# Patient Record
Sex: Female | Born: 1990 | Race: Asian | Hispanic: No | Marital: Married | Smoking: Never smoker
Health system: Southern US, Community
[De-identification: ages and names within clinical notes are randomized; demographics above are authoritative.]

## PROBLEM LIST (undated history)

## (undated) ENCOUNTER — Inpatient Hospital Stay (HOSPITAL_COMMUNITY): Payer: Self-pay

## (undated) ENCOUNTER — Inpatient Hospital Stay (HOSPITAL_COMMUNITY): Payer: No Typology Code available for payment source

## (undated) DIAGNOSIS — Z789 Other specified health status: Secondary | ICD-10-CM

## (undated) HISTORY — DX: Other specified health status: Z78.9

## (undated) HISTORY — PX: TONSILLECTOMY: SUR1361

---
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
## 2018-08-02 HISTORY — PX: COSMETIC SURGERY: SHX468

## 2020-01-02 NOTE — L&D Delivery Note (Signed)
OB/GYN Faculty Practice Delivery Note  Shoua Edrick Oh is a 30 y.o. G1P1001 s/p NSVD at [redacted]w[redacted]d. She was admitted for IOL for oligohydramnios.   ROM: 11h 20m with light meconium fluid GBS Status: Negative Maximum Maternal Temperature: 98.8  Labor Progress: Patient represented for IOL for oligohydramnios, had a cooks placed and received cytotec, and then progressed to pitocin and was complete  Delivery Date/Time: 09/10/20 at 0854 Delivery: Called to room and patient was complete and pushing. Head delivered OA. Loose nuchal cord present and delivered through. There was then a 30 second shoulder dystocia that was relieved with McRoberts and suprapubic pressure and body delivered in usual fashion. Infant was initially stunned and the Cord was clamped x 2 and cut by Dr. Ephriam Jenkins. Cord blood drawn. Placenta delivered spontaneously with gentle cord traction. Fundus firm with massage and Pitocin. Labia, perineum, vagina, and cervix inspected and found to have a 2nd degree laceration which was repaired with 3-0 Vicryl suture  Placenta: 3 vessel cord, intact, to L&D Complications: 30 second shoulder dystocia Lacerations: 2nd degree EBL: 200cc Analgesia: epidural  Infant: female  APGARs 7,9  3756g  Warner Mccreedy, MD, MPH OB Fellow, Faculty Practice Center for [employer], [facility]

## 2020-01-19 ENCOUNTER — Other Ambulatory Visit: Payer: Self-pay

## 2020-01-19 ENCOUNTER — Ambulatory Visit (INDEPENDENT_AMBULATORY_CARE_PROVIDER_SITE_OTHER): Payer: Self-pay

## 2020-01-19 DIAGNOSIS — Z3201 Encounter for pregnancy test, result positive: Secondary | ICD-10-CM

## 2020-01-19 LAB — POCT URINE PREGNANCY: Preg Test, Ur: POSITIVE — AB

## 2020-01-19 NOTE — Progress Notes (Unsigned)
Patient presented to the office today for UPT. Patient took a pregnancy test at home and it was positive.  UPT: Positive LMP: 12/03/2019 EDD: Approximate 09/15/2020 Patient advise to start prenatal care around 10 weeks.

## 2020-02-23 ENCOUNTER — Ambulatory Visit (INDEPENDENT_AMBULATORY_CARE_PROVIDER_SITE_OTHER): Payer: PRIVATE HEALTH INSURANCE

## 2020-02-23 ENCOUNTER — Other Ambulatory Visit: Payer: Self-pay

## 2020-02-23 VITALS — BP 122/75 | HR 72 | Ht 64.0 in | Wt 196.9 lb

## 2020-02-23 DIAGNOSIS — Z789 Other specified health status: Secondary | ICD-10-CM

## 2020-02-23 DIAGNOSIS — Z3400 Encounter for supervision of normal first pregnancy, unspecified trimester: Secondary | ICD-10-CM | POA: Insufficient documentation

## 2020-02-23 DIAGNOSIS — Z3401 Encounter for supervision of normal first pregnancy, first trimester: Secondary | ICD-10-CM | POA: Insufficient documentation

## 2020-02-23 DIAGNOSIS — O3680X0 Pregnancy with inconclusive fetal viability, not applicable or unspecified: Secondary | ICD-10-CM

## 2020-02-23 MED ORDER — VITAFOL ULTRA 29-0.6-0.4-200 MG PO CAPS: 1.0000 | ORAL_CAPSULE | Freq: Every day | ORAL | 11 refills | Status: DC

## 2020-02-23 NOTE — Progress Notes (Signed)
PRENATAL INTAKE SUMMARY  Ms. Edrick Oh presents today New OB Nurse Interview.  OB History    Gravida  1   Para      Term      Preterm      AB      Living  0     SAB      IAB      Ectopic      Multiple      Live Births  0          I have reviewed the patient's medical, obstetrical, social, and family histories, medications, and available lab results.  SUBJECTIVE She has no unusual complaints  OBJECTIVE Initial Physical Exam (New OB)  GENERAL APPEARANCE: alert, well appearing   ASSESSMENT Normal pregnancy  PLAN Prenatal care to be completed at [facility] All New OB labs to be completed at [facility] provider visit Baby Scripts ordered Blood pressure cuff given U/S performed today reveals single live IUP at [facility] score:0 GAD 7 score: 2

## 2020-02-24 ENCOUNTER — Telehealth: Payer: Self-pay

## 2020-03-01 ENCOUNTER — Encounter: Payer: Self-pay | Admitting: Advanced Practice Midwife

## 2020-03-01 ENCOUNTER — Other Ambulatory Visit (HOSPITAL_COMMUNITY)
Admission: RE | Admit: 2020-03-01 | Discharge: 2020-03-01 | Disposition: A | Discharge status: Home / Self Care | Payer: 59 | Source: Ambulatory Visit | Attending: Advanced Practice Midwife | Admitting: Advanced Practice Midwife

## 2020-03-01 ENCOUNTER — Ambulatory Visit (INDEPENDENT_AMBULATORY_CARE_PROVIDER_SITE_OTHER): Payer: PRIVATE HEALTH INSURANCE | Admitting: Advanced Practice Midwife

## 2020-03-01 ENCOUNTER — Other Ambulatory Visit: Payer: Self-pay

## 2020-03-01 VITALS — BP 130/73 | HR 82 | Wt 194.0 lb

## 2020-03-01 DIAGNOSIS — Z124 Encounter for screening for malignant neoplasm of cervix: Secondary | ICD-10-CM | POA: Diagnosis not present

## 2020-03-01 DIAGNOSIS — Z3A11 11 weeks gestation of pregnancy: Secondary | ICD-10-CM | POA: Diagnosis not present

## 2020-03-01 DIAGNOSIS — Z3401 Encounter for supervision of normal first pregnancy, first trimester: Secondary | ICD-10-CM

## 2020-03-01 DIAGNOSIS — Z23 Encounter for immunization: Secondary | ICD-10-CM | POA: Diagnosis not present

## 2020-03-01 NOTE — Progress Notes (Signed)
Pt presents today for NOB visit. Pt declines genetic screening. No complaints today.

## 2020-03-01 NOTE — Progress Notes (Signed)
Subjective:   Madeline Cooper is a 30 y.o. G1P0 at [redacted]w[redacted]d by LMP, c/w 11 week [place], being seen today for her first obstetrical visit.  Her obstetrical history is significant for none G1 and has Encounter for supervision of normal first pregnancy in first trimester on their problem list.. Patient does intend to breast feed. Pregnancy history fully reviewed.  Patient reports nausea.  HISTORY: OB History  Gravida Para Term Preterm AB Living  1 0 0 0 0 0  SAB IAB Ectopic Multiple Live Births  0 0 0 0 0    # Outcome Date GA Lbr Len/2nd Weight Sex Delivery Anes PTL Lv  1 Current            History reviewed. No pertinent past medical history. Past Surgical History:  Procedure Laterality Date  . COSMETIC SURGERY  08/2018   Eyelids  . TONSILECTOMY, ADENOIDECTOMY, BILATERAL MYRINGOTOMY AND TUBES  1999  . WISDOM TOOTH EXTRACTION  2009   Family History  Problem Relation Age of Onset  . Diabetes Mother   . Hypertension Maternal Grandfather   . Dementia Maternal Grandfather    Social History   Tobacco Use  . Smoking status: Never Smoker  . Smokeless tobacco: Never Used  Vaping Use  . Vaping Use: Never used  Substance Use Topics  . Alcohol use: Not Currently    Comment: last drink December 2021  . Drug use: Never   No Known Allergies Current Outpatient Medications on File Prior to Visit  Medication Sig Dispense Refill  . Prenat-Fe Poly-Methfol-FA-DHA (VITAFOL ULTRA) 29-0.6-0.4-200 MG CAPS Take 1 capsule by mouth daily. 30 capsule 11   No current facility-administered medications on file prior to visit.     Indications for ASA therapy (per uptodate) One of the following: Previous pregnancy with preeclampsia, especially early onset and with an adverse outcome No Multifetal gestation No Chronic hypertension No Type 1 or 2 diabetes mellitus No Chronic kidney disease No Autoimmune disease (antiphospholipid syndrome, systemic lupus erythematosus) No   Two or more of the  following: Nulliparity Yes Obesity (body mass index >30 kg/m2) No Family history of preeclampsia in mother or sister No Age ?35 years No Sociodemographic characteristics (African American race, low socioeconomic level) No Personal risk factors (eg, previous pregnancy with low birth weight or small for gestational age infant, previous adverse pregnancy outcome [eg, stillbirth], interval >10 years between pregnancies) No   Indications for early 1 hour GTT (per uptodate)  BMI >25 (>23 in Asian women) AND one of the following  Gestational diabetes mellitus in a previous pregnancy No Glycated hemoglobin ?5.7 percent (39 mmol/mol), impaired glucose tolerance, or impaired fasting glucose on previous testing No First-degree relative with diabetes No High-risk race/ethnicity (eg, African American, Latino, Native American, [place] American, Pacific Islander) Yes History of cardiovascular disease No Hypertension or on therapy for hypertension No High-density lipoprotein cholesterol level <35 mg/dL (2.84 mmol/L) and/or a triglyceride level >250 mg/dL (1.32 mmol/L) No Polycystic ovary syndrome No Physical inactivity No Other clinical condition associated with insulin resistance (eg, severe obesity, acanthosis nigricans) No Previous birth of an infant weighing ?4000 g No Previous stillbirth of unknown cause No Exam   Vitals:   03/01/20 1424  BP: 130/73  Pulse: 82  Weight: 194 lb (88 kg)      Uterus:     Pelvic Exam: Perineum: no hemorrhoids, normal perineum   Vulva: normal external genitalia, no lesions   Vagina:  normal mucosa, normal discharge  Cervix: no lesions and normal, pap smear done.    Adnexa: normal adnexa and no mass, fullness, tenderness   Bony Pelvis: average  System: General: well-developed, well-nourished female in no acute distress   Breast:  normal appearance, no masses or tenderness   Skin: normal coloration and turgor, no rashes   Neurologic: oriented, normal,  negative, normal mood   Extremities: normal strength, tone, and muscle mass, ROM of all joints is normal   HEENT PERRLA, extraocular movement intact and sclera clear, anicteric   Mouth/Teeth mucous membranes moist, pharynx normal without lesions and dental hygiene good   Neck supple and no masses   Cardiovascular: regular rate and rhythm   Respiratory:  no respiratory distress, normal breath sounds   Abdomen: soft, non-tender; bowel sounds normal; no masses,  no organomegaly     Assessment:   Pregnancy: G1P0 Patient Active Problem List   Diagnosis Date Noted  . Encounter for supervision of normal first pregnancy in first trimester 02/23/2020     Plan:  1. Encounter for supervision of normal first pregnancy in first trimester --Anticipatory guidance about next visits/weeks of pregnancy given. --next visit in 4 weeks in the office  - Cervicovaginal ancillary only - Culture, OB Urine - CBC/D/Plt+RPR+Rh+ABO+Rub Ab... - Genetic Screening - Flu Vaccine QUAD 36+ mos IM (Fluarix, Fluzone & Afluria Quad PF - Hemoglobin A1c  2. [redacted] weeks gestation of pregnancy    Initial labs drawn. Continue prenatal vitamins. Discussed and offered genetic screening options, including Quad screen/AFP, NIPS testing, and option to decline testing. Benefits/risks/alternatives reviewed. Pt aware that anatomy US is form of genetic screening with lower accuracy in detecting trisomies than blood work.  Pt declines genetic screening today. NIPS: declined. Ultrasound discussed; fetal anatomic survey: requested. Problem list reviewed and updated. The nature of [facility] with multiple MDs and other Advanced Practice Providers was explained to patient; also emphasized that residents, students are part of our team. Routine obstetric precautions reviewed. No follow-ups on file.   Sharen Counter, CNM 03/01/20 5:22 PM

## 2020-03-02 ENCOUNTER — Other Ambulatory Visit: Payer: Self-pay | Admitting: Advanced Practice Midwife

## 2020-03-02 ENCOUNTER — Other Ambulatory Visit (HOSPITAL_COMMUNITY)
Admission: RE | Admit: 2020-03-02 | Discharge: 2020-03-02 | Disposition: A | Discharge status: Home / Self Care | Payer: 59 | Source: Ambulatory Visit | Attending: Advanced Practice Midwife | Admitting: Advanced Practice Midwife

## 2020-03-02 DIAGNOSIS — Z124 Encounter for screening for malignant neoplasm of cervix: Secondary | ICD-10-CM | POA: Diagnosis present

## 2020-03-02 LAB — CBC/D/PLT+RPR+RH+ABO+RUB AB...
Antibody Screen: NEGATIVE
Basophils Absolute: 0.1 10*3/uL (ref 0.0–0.2)
Basos: 0 %
EOS (ABSOLUTE): 0.1 10*3/uL (ref 0.0–0.4)
Eos: 1 %
HCV Ab: <0.1 s/co ratio (ref 0.0–0.9)
HIV Screen 4th Generation wRfx: NONREACTIVE
Hematocrit: 39.8 % (ref 34.0–46.6)
Hemoglobin: 13.4 g/dL (ref 11.1–15.9)
Hepatitis B Surface Ag: NEGATIVE
Immature Grans (Abs): 0 10*3/uL (ref 0.0–0.1)
Immature Granulocytes: 0 %
Lymphocytes Absolute: 2.2 10*3/uL (ref 0.7–3.1)
Lymphs: 14 %
MCH: 30.7 pg (ref 26.6–33.0)
MCHC: 33.7 g/dL (ref 31.5–35.7)
MCV: 91 fL (ref 79–97)
Monocytes Absolute: 1.3 10*3/uL — ABNORMAL HIGH (ref 0.1–0.9)
Monocytes: 8 %
Neutrophils Absolute: 11.9 10*3/uL — ABNORMAL HIGH (ref 1.4–7.0)
Neutrophils: 77 %
Platelets: 354 10*3/uL (ref 150–450)
RBC: 4.36 x10E6/uL (ref 3.77–5.28)
RDW: 12.4 % (ref 11.7–15.4)
RPR Ser Ql: NONREACTIVE
Rh Factor: POSITIVE
Rubella Antibodies, IGG: 1.95 index (ref >0.99)
WBC: 15.6 10*3/uL — ABNORMAL HIGH (ref 3.4–10.8)

## 2020-03-02 LAB — CERVICOVAGINAL ANCILLARY ONLY
Chlamydia: NEGATIVE
Comment: NEGATIVE
Comment: NEGATIVE
Comment: NORMAL
Neisseria Gonorrhea: NEGATIVE
Trichomonas: NEGATIVE

## 2020-03-02 LAB — HEMOGLOBIN A1C
Est. average glucose Bld gHb Est-mCnc: 105 mg/dL
Hgb A1c MFr Bld: 5.3 % (ref 4.8–5.6)

## 2020-03-02 LAB — HCV INTERPRETATION

## 2020-03-02 NOTE — Addendum Note (Signed)
Addended by: Sharen Counter A on: 03/02/2020 09:14 AM   Modules accepted: Orders

## 2020-03-03 LAB — URINE CULTURE, OB REFLEX

## 2020-03-03 LAB — CYTOLOGY - PAP: Diagnosis: NEGATIVE

## 2020-03-03 LAB — CULTURE, OB URINE

## 2020-03-14 ENCOUNTER — Telehealth: Payer: Self-pay

## 2020-03-14 NOTE — Telephone Encounter (Signed)
Pt LVM has questions about "gender appt"  Unable to reach pt; vm full.

## 2020-03-29 ENCOUNTER — Other Ambulatory Visit: Payer: Self-pay

## 2020-03-29 ENCOUNTER — Ambulatory Visit (INDEPENDENT_AMBULATORY_CARE_PROVIDER_SITE_OTHER): Payer: PRIVATE HEALTH INSURANCE | Admitting: Obstetrics & Gynecology

## 2020-03-29 DIAGNOSIS — Z3401 Encounter for supervision of normal first pregnancy, first trimester: Secondary | ICD-10-CM

## 2020-03-29 NOTE — Progress Notes (Signed)
Pt is unsure of getting AFP test due to ins coverage.  Pt made aware she may have drawn up to 21 weeks.  If she chooses to verify coverage and have drawn before that time.

## 2020-03-29 NOTE — Progress Notes (Signed)
   PRENATAL VISIT NOTE  Subjective:  Madeline Cooper is a 30 y.o. G1P0 at [redacted]w[redacted]d being seen today for ongoing prenatal care.  She is currently monitored for the following issues for this low-risk pregnancy and has Encounter for supervision of normal first pregnancy in first trimester on their problem list.  Patient reports heartburn and nausea.  Contractions: Not present. Vag. Bleeding: None.   . Denies leaking of fluid.   The following portions of the patient's history were reviewed and updated as appropriate: allergies, current medications, past family history, past medical history, past social history, past surgical history and problem list.   Objective:   Vitals:   03/29/20 1459  BP: 130/84  Pulse: 65  Weight: 198 lb (89.8 kg)    Fetal Status: Fetal Heart Rate (bpm): 140         General:  Alert, oriented and cooperative. Patient is in no acute distress.  Skin: Skin is warm and dry. No rash noted.   Cardiovascular: Normal heart rate noted  Respiratory: Normal respiratory effort, no problems with respiration noted  Abdomen: Soft, gravid, appropriate for gestational age.  Pain/Pressure: Absent     Pelvic: Cervical exam deferred        Extremities: Normal range of motion.     Mental Status: Normal mood and affect. Normal behavior. Normal judgment and thought content.   Assessment and Plan:  Pregnancy: G1P0 at [redacted]w[redacted]d 1. Encounter for supervision of normal first pregnancy in first trimester We discussed her heartburn and she wants to see if she can manage without medication  Preterm labor symptoms and general obstetric precautions including but not limited to vaginal bleeding, contractions, leaking of fluid and fetal movement were reviewed in detail with the patient. Please refer to After Visit Summary for other counseling recommendations.   Return in about 4 weeks (around 04/26/2020).  No future appointments.  Scheryl Darter, MD

## 2020-03-29 NOTE — Patient Instructions (Signed)

## 2020-04-26 ENCOUNTER — Encounter: Payer: PRIVATE HEALTH INSURANCE | Admitting: Obstetrics and Gynecology

## 2020-04-27 ENCOUNTER — Ambulatory Visit (INDEPENDENT_AMBULATORY_CARE_PROVIDER_SITE_OTHER): Payer: PRIVATE HEALTH INSURANCE | Admitting: Obstetrics and Gynecology

## 2020-04-27 ENCOUNTER — Encounter: Payer: Self-pay | Admitting: Obstetrics and Gynecology

## 2020-04-27 ENCOUNTER — Ambulatory Visit: Payer: 59 | Attending: Obstetrics & Gynecology

## 2020-04-27 ENCOUNTER — Other Ambulatory Visit: Payer: Self-pay | Admitting: Obstetrics & Gynecology

## 2020-04-27 ENCOUNTER — Other Ambulatory Visit: Payer: Self-pay

## 2020-04-27 DIAGNOSIS — Z3401 Encounter for supervision of normal first pregnancy, first trimester: Secondary | ICD-10-CM

## 2020-04-27 NOTE — Patient Instructions (Signed)

## 2020-04-27 NOTE — Progress Notes (Signed)
Subjective:  Madeline Cooper is a 30 y.o. G1P0 at [redacted]w[redacted]d being seen today for ongoing prenatal care.  She is currently monitored for the following issues for this low-risk pregnancy and has Encounter for supervision of normal first pregnancy in first trimester on their problem list.  Patient reports no complaints.  Contractions: Not present. Vag. Bleeding: None.  Movement: Absent. Denies leaking of fluid.   The following portions of the patient's history were reviewed and updated as appropriate: allergies, current medications, past family history, past medical history, past social history, past surgical history and problem list. Problem list updated.  Objective:   Vitals:   04/27/20 1544  BP: 118/75  Pulse: 94  Weight: 202 lb (91.6 kg)    Fetal Status: Fetal Heart Rate (bpm): 142   Movement: Absent     General:  Alert, oriented and cooperative. Patient is in no acute distress.  Skin: Skin is warm and dry. No rash noted.   Cardiovascular: Normal heart rate noted  Respiratory: Normal respiratory effort, no problems with respiration noted  Abdomen: Soft, gravid, appropriate for gestational age. Pain/Pressure: Present     Pelvic:  Cervical exam deferred        Extremities: Normal range of motion.  Edema: None  Mental Status: Normal mood and affect. Normal behavior. Normal judgment and thought content.   Urinalysis:      Assessment and Plan:  Pregnancy: G1P0 at [redacted]w[redacted]d  1. Encounter for supervision of normal first pregnancy in first trimester Stable Declines AFP due to insurance coverage Anatomy scan today, f/u in 4 weeks to complete anatomy.  Preterm labor symptoms and general obstetric precautions including but not limited to vaginal bleeding, contractions, leaking of fluid and fetal movement were reviewed in detail with the patient. Please refer to After Visit Summary for other counseling recommendations.  Return in about 4 weeks (around 05/25/2020) for virtual, any  provider.   Hermina Staggers, MD

## 2020-04-27 NOTE — Progress Notes (Signed)
ROB [redacted]w[redacted]d  AFP Due today   CC: None

## 2020-04-28 ENCOUNTER — Other Ambulatory Visit: Payer: Self-pay | Admitting: *Deleted

## 2020-04-28 DIAGNOSIS — Z362 Encounter for other antenatal screening follow-up: Secondary | ICD-10-CM

## 2020-05-25 ENCOUNTER — Telehealth: Payer: Self-pay | Admitting: *Deleted

## 2020-05-25 ENCOUNTER — Encounter: Payer: Self-pay | Admitting: Obstetrics and Gynecology

## 2020-05-25 ENCOUNTER — Telehealth (INDEPENDENT_AMBULATORY_CARE_PROVIDER_SITE_OTHER): Payer: PRIVATE HEALTH INSURANCE | Admitting: Obstetrics and Gynecology

## 2020-05-25 DIAGNOSIS — Z3A23 23 weeks gestation of pregnancy: Secondary | ICD-10-CM

## 2020-05-25 DIAGNOSIS — Z3402 Encounter for supervision of normal first pregnancy, second trimester: Secondary | ICD-10-CM

## 2020-05-25 DIAGNOSIS — Z3401 Encounter for supervision of normal first pregnancy, first trimester: Secondary | ICD-10-CM

## 2020-05-25 NOTE — Progress Notes (Signed)
Taking OTC prenatal vitamins. Reports episode of "braxton hicks", cramping and abdominal tightening with full bladder. Resolved with urination.

## 2020-05-25 NOTE — Patient Instructions (Signed)

## 2020-05-25 NOTE — Telephone Encounter (Signed)
Telephone visit for Routine OB.

## 2020-05-25 NOTE — Progress Notes (Signed)
OBSTETRICS PRENATAL VIRTUAL VISIT ENCOUNTER NOTE  [facility]   Patient location: Home  I connected with Madeline Cooper on 05/25/20 at  4:00 PM EDT by MyChart Video Encounter and verified that I am speaking with the correct person using two identifiers. I discussed the limitations, risks, security and privacy concerns of performing an evaluation and management service virtually and the availability of in person appointments. I also discussed with the patient that there may be Cooper patient responsible charge related to this service. The patient expressed understanding and agreed to proceed. Subjective:  Madeline Cooper is Cooper 30 y.o. G1P0 at [redacted]w[redacted]d being seen today for ongoing prenatal care.  She is currently monitored for the following issues for this low-risk pregnancy and has Encounter for supervision of normal first pregnancy in first trimester on their problem list.  Patient reports no complaints.  Contractions: Irritability. Vag. Bleeding: None.  Movement: Present. Denies any leaking of fluid.   The following portions of the patient's history were reviewed and updated as appropriate: allergies, current medications, past family history, past medical history, past social history, past surgical history and problem list.   Objective:   Vitals:   05/25/20 1604  BP: 115/72  Pulse: 86  Weight: 95.3 kg    Fetal Status:     Movement: Present     General:  Alert, oriented and cooperative. Patient is in no acute distress.  Respiratory: Normal respiratory effort, no problems with respiration noted  Mental Status: Normal mood and affect. Normal behavior. Normal judgment and thought content.  Rest of physical exam deferred due to type of encounter  Imaging: [place] MFM OB DETAIL +14 WK  Result Date: 04/27/2020 ----------------------------------------------------------------------  OBSTETRICS REPORT                       (Signed Final 04/27/2020 01:26  pm) ---------------------------------------------------------------------- Patient Info  ID #:       [id number]                          D.O.B.:  04/29/1990 (30 yrs)  Name:       Madeline Cooper                Visit Date: 04/27/2020 12:57 pm ---------------------------------------------------------------------- Performed By  Attending:        Noralee Space Cooper        Ref. Address:     [address]                                                             [place], [place]  [id number]  Performed By:     Madeline Cooper RDMS       [facility]  Referred By:      Madeline Cooper                    Madeline Cooper ---------------------------------------------------------------------- Orders  #  Description                           Code        Ordered By  1  US MFM OB DETAIL +14 WK               76811.01    Madeline Cooper ----------------------------------------------------------------------  #  Order #                     Accession #                Episode #  1  [id number]                   [id number]                 [id number] ---------------------------------------------------------------------- Indications  [redacted] weeks gestation of pregnancy                Z3A.20  Obesity complicating pregnancy, second         O99.212  trimester BMI 34  Encounter for antenatal screening for          Z36.3  malformations ---------------------------------------------------------------------- Fetal Evaluation  Num Of Fetuses:         1  Fetal Heart Rate(bpm):  147  Cardiac Activity:       Observed  Presentation:           Cephalic  Placenta:               Anterior  P. Cord Insertion:      Visualized,  central  Amniotic Fluid  AFI FV:      Within normal limits                              Largest Pocket(cm)  7 ---------------------------------------------------------------------- Biometry  BPD:        49  mm     G. Age:  20w 6d         66  %    CI:        70.47   %    70 - 86                                                          FL/HC:      17.6   %    16.8 - 19.8  HC:      186.1  mm     G. Age:  21w 0d         65  %    HC/AC:      1.15        1.09 - 1.39  AC:      161.8  mm     G. Age:  21w 2d         71  %    FL/BPD:     66.7   %  FL:       32.7  mm     G. Age:  20w 1d         34  %    FL/AC:      20.2   %    20 - 24  HUM:      32.1  mm     G. Age:  20w 5d         59  %  CER:      21.4  mm     G. Age:  20w 2d         62  %  NFT:       3.5  mm  LV:        6.9  mm  CM:        5.5  mm  Est. FW:     378  gm    0 lb 13 oz      66  % ---------------------------------------------------------------------- Gestational Age  U/S Today:     20w 6d                                        EDD:   09/08/20  Best:          Madeline Cooper 3d     Det. By:  U/S C R L  (02/23/20)    EDD:   09/11/20 ---------------------------------------------------------------------- Anatomy  Cranium:               Appears normal         LVOT:                   Appears normal  Cavum:                 Appears normal         Aortic Arch:            Appears normal  Ventricles:            Appears normal         Ductal Arch:  Appears normal  Choroid Plexus:        Appears normal         Diaphragm:              Appears normal  Cerebellum:            Appears normal         Stomach:                Appears normal, left                                                                        sided  Posterior Fossa:       Appears normal         Abdomen:                Appears normal  Nuchal Fold:           Appears normal         Abdominal Wall:         Appears nml (cord                                                                         insert, abd wall)  Face:                  Appears normal         Cord Vessels:           Appears normal (3                         (orbits and profile)                           vessel cord)  Lips:                  Appears normal         Kidneys:                Appear normal  Palate:                Appears normal         Bladder:                Appears normal  Thoracic:              Appears normal         Spine:                  Not well visualized  Heart:                 Appears normal         Upper Extremities:      Appears normal                         (4CH, axis,  and                         situs)  RVOT:                  Appears normal         Lower Extremities:      Appears normal  Other:  Fetus appears to be Cooper female. Heels and 5th digit visualized. Nasal          bone visualized. VC, 3VV and 3VTV visualized. ---------------------------------------------------------------------- Cervix Uterus Adnexa  Cervix  Length:           4.95  cm.  Normal appearance by transabdominal scan.  Right Ovary  Not visualized.  Left Ovary  Not visualized.  Adnexa  No abnormality visualized. ---------------------------------------------------------------------- Impression  G1 P0. Patient is here for fetal anatomy scan.  Patient had opted not to screen for fetal aneuploidies.  We performed fetal anatomy scan. No makers of  aneuploidies or fetal structural defects are seen. Fetal  biometry is consistent with her previously-established dates.  Amniotic fluid is normal and good fetal activity is seen.  Patient understands the limitations of ultrasound in detecting  fetal anomalies. ---------------------------------------------------------------------- Recommendations  -An appointment was made for her to return in 4 weeks for  completion of fetal anatomy (fetal spine). ----------------------------------------------------------------------                  Madeline Space, Cooper Electronically Signed Final Report   04/27/2020 01:26 pm  ----------------------------------------------------------------------   Assessment and Plan:  Pregnancy: G1P0 at [redacted]w[redacted]d 1. Encounter for supervision of normal first pregnancy in first trimester Stable F/U U/S 05/27/20 Glucola next visit  Preterm labor symptoms and general obstetric precautions including but not limited to vaginal bleeding, contractions, leaking of fluid and fetal movement were reviewed in detail with the patient. I discussed the assessment and treatment plan with the patient. The patient was provided an opportunity to ask questions and all were answered. The patient agreed with the plan and demonstrated an understanding of the instructions. The patient was advised to call back or seek an in-person office evaluation/go to MAU at [facility] for any urgent or concerning symptoms. Please refer to After Visit Summary for other counseling recommendations.   I provided 8 minutes of face-to-face time during this encounter.  Return in about 4 weeks (around 06/22/2020) for OB visit, face to face, any provider, fasting for Glucola.  Future Appointments  Date Time Provider Department Center  05/27/2020  2:30 PM [facility] NURSE [facility]  05/27/2020  2:45 PM [facility]    Hermina Staggers, [facility] for [facility], [facility]

## 2020-05-27 ENCOUNTER — Encounter: Payer: Self-pay | Admitting: *Deleted

## 2020-05-27 ENCOUNTER — Ambulatory Visit: Payer: PRIVATE HEALTH INSURANCE | Attending: Obstetrics and Gynecology

## 2020-05-27 ENCOUNTER — Ambulatory Visit: Payer: PRIVATE HEALTH INSURANCE | Admitting: *Deleted

## 2020-05-27 ENCOUNTER — Other Ambulatory Visit: Payer: Self-pay | Admitting: Obstetrics

## 2020-05-27 ENCOUNTER — Other Ambulatory Visit: Payer: Self-pay

## 2020-05-27 DIAGNOSIS — O3662X0 Maternal care for excessive fetal growth, second trimester, not applicable or unspecified: Secondary | ICD-10-CM

## 2020-05-27 DIAGNOSIS — Z362 Encounter for other antenatal screening follow-up: Secondary | ICD-10-CM | POA: Diagnosis present

## 2020-05-27 DIAGNOSIS — Z3401 Encounter for supervision of normal first pregnancy, first trimester: Secondary | ICD-10-CM | POA: Diagnosis present

## 2020-05-27 DIAGNOSIS — O99212 Obesity complicating pregnancy, second trimester: Secondary | ICD-10-CM

## 2020-06-22 ENCOUNTER — Other Ambulatory Visit: Payer: PRIVATE HEALTH INSURANCE

## 2020-06-22 ENCOUNTER — Ambulatory Visit (INDEPENDENT_AMBULATORY_CARE_PROVIDER_SITE_OTHER): Payer: PRIVATE HEALTH INSURANCE | Admitting: Women's Health

## 2020-06-22 ENCOUNTER — Other Ambulatory Visit: Payer: Self-pay

## 2020-06-22 VITALS — BP 109/75 | HR 96 | Wt 213.0 lb

## 2020-06-22 DIAGNOSIS — Z3A27 27 weeks gestation of pregnancy: Secondary | ICD-10-CM

## 2020-06-22 DIAGNOSIS — Z3401 Encounter for supervision of normal first pregnancy, first trimester: Secondary | ICD-10-CM | POA: Diagnosis not present

## 2020-06-22 DIAGNOSIS — O2602 Excessive weight gain in pregnancy, second trimester: Secondary | ICD-10-CM

## 2020-06-22 DIAGNOSIS — Z23 Encounter for immunization: Secondary | ICD-10-CM

## 2020-06-22 NOTE — Patient Instructions (Signed)
Maternity Assessment Unit (MAU)  The Maternity Assessment Unit (MAU) is located at the [facility] and [facility]. The address is: [address] [place], [place], [place] [address]. Please see map below for additional directions.    The Maternity Assessment Unit is designed to help you during your pregnancy, and for up to 6 weeks after delivery, with any pregnancy- or postpartum-related emergencies, if you think you are in labor, or if your water has broken. For example, if you experience nausea and vomiting, vaginal bleeding, severe abdominal or pelvic pain, elevated blood pressure or other problems related to your pregnancy or postpartum time, please come to the Maternity Assessment Unit for assistance.       Oral Glucose Tolerance Test During Pregnancy Why am I having this test? The oral glucose tolerance test (OGTT) is done to check how your body processes blood sugar (glucose). This is one of several tests used to diagnose diabetes that develops during pregnancy (gestational diabetes mellitus). Gestational diabetes is a short-term form of diabetes that some women develop while they are pregnant. It usually occurs during the second trimesterof pregnancy and goes away after delivery. Testing, or screening, for gestational diabetes usually occurs at weeks 24-28 of pregnancy. You may have the OGTT test after having a 1-hour glucose screening test if the results from that test indicate that you may have gestational diabetes. This test may also be needed if: You have a history of gestational diabetes. There is a history of giving birth to very large babies or of losing pregnancies (having stillbirths). You have signs and symptoms of diabetes, such as: Changes in your eyesight. Tingling or numbness in your hands or feet. Changes in hunger, thirst, and urination, and these are not explained by your pregnancy. What is being tested? This test measures the amount  of glucose in your blood at different timesduring a period of 3 hours. This shows how well your body can process glucose. What kind of sample is taken?  Blood samples are required for this test. They are usually collected byinserting a needle into a blood vessel. How do I prepare for this test? For 3 days before your test, eat normally. Have plenty of carbohydrate-rich foods. Follow instructions from your health care provider about: Eating or drinking restrictions on the day of the test. You may be asked not to eat or drink anything other than water (to fast) starting 8-10 hours before the test. Changing or stopping your regular medicines. Some medicines may interfere with this test. Tell a health care provider about: All medicines you are taking, including vitamins, herbs, eye drops, creams, and over-the-counter medicines. Any blood disorders you have. Any surgeries you have had. Any medical conditions you have. What happens during the test? First, your blood glucose will be measured. This is referred to as your fasting blood glucose because you fasted before the test. Then, you will drink a glucose solution that contains a certain amount of glucose. Your blood glucosewill be measured again 1, 2, and 3 hours after you drink the solution. This test takes about 3 hours to complete. You will need to stay at the testing location during this time. During the testing period: Do not eat or drink anything other than the glucose solution. Do not exercise. Do not use any products that contain nicotine or tobacco, such as cigarettes, e-cigarettes, and chewing tobacco. These can affect your test results. If you need help quitting, ask your health care provider. The testing  procedure may vary among health care providers and hospitals. How are the results reported? Your results will be reported as milligrams of glucose per deciliter of blood (mg/dL) or millimoles per liter (mmol/L). There is more than one  source for screening and diagnosis reference values used to diagnose gestational diabetes. Your health care provider will compare your results to normal values that were established after testing a large group of people (reference values). Reference values may vary among labs and hospitals. For this test (Carpenter-Coustan), reference values are: Fasting: 95 mg/dL (5.3 mmol/L). 1 hour: 180 mg/dL (81.1 mmol/L). 2 hour: 155 mg/dL (8.6 mmol/L). 3 hour: 140 mg/dL (7.8 mmol/L). What do the results mean? Results below the reference values are considered normal. If two or more of your blood glucose levels are at or above the reference values, you may be diagnosed with gestational diabetes. If only one level is high, your healthcare provider may suggest repeat testing or other tests to confirm a diagnosis. Talk with your health care provider about what your results mean. Questions to ask your health care provider Ask your health care provider, or the department that is doing the test: When will my results be ready? How will I get my results? What are my treatment options? What other tests do I need? What are my next steps? Summary The oral glucose tolerance test (OGTT) is one of several tests used to diagnose diabetes that develops during pregnancy (gestational diabetes mellitus). Gestational diabetes is a short-term form of diabetes that some women develop while they are pregnant. You may have the OGTT test after having a 1-hour glucose screening test if the results from that test show that you may have gestational diabetes. You may also have this test if you have any symptoms or risk factors for this type of diabetes. Talk with your health care provider about what your results mean. This information is not intended to replace advice given to you by your health care provider. Make sure you discuss any questions you have with your healthcare provider. Document Revised: 05/28/2019 Document Reviewed:  05/28/2019 Elsevier Patient Education  2022 Elsevier Inc.       Tdap (Tetanus, Diphtheria, Pertussis) Vaccine: What You Need to Know 1. Why get vaccinated? Tdap vaccine can prevent tetanus, diphtheria, and pertussis. Diphtheria and pertussis spread from person to person. Tetanus enters the body through cuts or wounds. TETANUS (T) causes painful stiffening of the muscles. Tetanus can lead to serious health problems, including being unable to open the mouth, having trouble swallowing and breathing, or death. DIPHTHERIA (D) can lead to difficulty breathing, heart failure, paralysis, or death. PERTUSSIS (aP), also known as "whooping cough," can cause uncontrollable, violent coughing that makes it hard to breathe, eat, or drink. Pertussis can be extremely serious especially in babies and young children, causing pneumonia, convulsions, brain damage, or death. In teens and adults, it can cause weight loss, loss of bladder control, passing out, and rib fractures from severe coughing. 2. Tdap vaccine Tdap is only for children 7 years and older, adolescents, and adults.  Adolescents should receive a single dose of Tdap, preferably at age 75 or 12 years. Pregnant people should get a dose of Tdap during every pregnancy, preferably during the early part of the third trimester, to help protect the newborn from pertussis. Infants are most at risk for severe, life-threatening complications frompertussis. Adults who have never received Tdap should get a dose of Tdap. Also, adults should receive a booster dose of either Tdap or  Td (a different vaccine that protects against tetanus and diphtheria but not pertussis) every 10 years, or after 5 years in the case of a severe or dirty wound or burn. Tdap may be given at the same time as other vaccines. 3. Talk with your health care provider Tell your vaccine provider if the person getting the vaccine: Has had an allergic reaction after a previous dose of any  vaccine that protects against tetanus, diphtheria, or pertussis, or has any severe, life-threatening allergies Has had a coma, decreased level of consciousness, or prolonged seizures within 7 days after a previous dose of any pertussis vaccine (DTP, DTaP, or Tdap) Has seizures or another nervous system problem Has ever had Guillain-Barr Syndrome (also called "GBS") Has had severe pain or swelling after a previous dose of any vaccine that protects against tetanus or diphtheria In some cases, your health care provider may decide to postpone Tdapvaccination until a future visit. People with minor illnesses, such as a cold, may be vaccinated. People who are moderately or severely ill should usually wait until they recover beforegetting Tdap vaccine.  Your health care provider can give you more information. 4. Risks of a vaccine reaction Pain, redness, or swelling where the shot was given, mild fever, headache, feeling tired, and nausea, vomiting, diarrhea, or stomachache sometimes happen after Tdap vaccination. People sometimes faint after medical procedures, including vaccination. Tellyour provider if you feel dizzy or have vision changes or ringing in the ears.  As with any medicine, there is a very remote chance of a vaccine causing asevere allergic reaction, other serious injury, or death. 5. What if there is a serious problem? An allergic reaction could occur after the vaccinated person leaves the clinic. If you see signs of a severe allergic reaction (hives, swelling of the face and throat, difficulty breathing, a fast heartbeat, dizziness, or weakness), call 9-1-1and get the person to the nearest hospital. For other signs that concern you, call your health care provider.  Adverse reactions should be reported to the Vaccine Adverse Event Reporting System (VAERS). Your health care provider will usually file this report, or you can do it yourself. Visit the VAERS website at www.vaers.LAgents.no or call  8304666390. VAERS is only for reporting reactions, and VAERS staff members do not give medical advice. 6. The National Vaccine Injury Compensation Program The [employer] Vaccine Injury Compensation Program (VICP) is a federal program that was created to compensate people who may have been injured by certain vaccines. Claims regarding alleged injury or death due to vaccination have a time limit for filing, which may be as short as two years. Visit the VICP website at SpiritualWord.at or call 281 093 1052to learn about the program and about filing a claim. 7. How can I learn more? Ask your health care provider. Call your local or state health department. Visit the website of the Food and Drug Administration (FDA) for vaccine package inserts and additional information at FinderList.no. Contact the Centers for Disease Control and Prevention (CDC): Call (587)509-7158 (1-800-CDC-INFO) or Visit CDC's website at PicCapture.uy. Vaccine Information Statement Tdap (Tetanus, Diphtheria, Pertussis) Vaccine(08/07/2019) This information is not intended to replace advice given to you by your health care provider. Make sure you discuss any questions you have with your healthcare provider. Document Revised: 09/02/2019 Document Reviewed: 09/02/2019 Elsevier Patient Education  2022 [employer].       Contraception Choices (www.bedsider.org) Contraception, also called birth control, refers to methods or devices thatprevent pregnancy. Hormonal methods  Contraceptive implant A contraceptive implant is  a thin, plastic tube that contains a hormone that prevents pregnancy. It is different from an intrauterine device (IUD). It is inserted into the upper part of the arm by a health care provider. Implants canbe effective for up to 3 years. Progestin-only injections Progestin-only injections are injections of progestin, a synthetic form of thehormone  progesterone. They are given every 3 months by a health care provider. Birth control pills Birth control pills are pills that contain hormones that prevent pregnancy. They must be taken once a day, preferably at the same time each day. Aprescription is needed to use this method of contraception. Birth control patch The birth control patch contains hormones that prevent pregnancy. It is placed on the skin and must be changed once a week for three weeks and removed on thefourth week. A prescription is needed to use this method of contraception. Vaginal ring A vaginal ring contains hormones that prevent pregnancy. It is placed in the vagina for three weeks and removed on the fourth week. After that, the process is repeated with a new ring. A prescription is needed to use this method ofcontraception. Emergency contraceptive Emergency contraceptives prevent pregnancy after unprotected sex. They come in pill form and can be taken up to 5 days after sex. They work best the sooner they are taken after having sex. Most emergency contraceptives are available without a prescription. This method should not be used as your only form ofbirth control. Barrier methods  Female condom A female condom is a thin sheath that is worn over the penis during sex. Condoms keep sperm from going inside a woman's body. They can be used with a sperm-killing substance (spermicide) to increase their effectiveness. They should be thrown away after one use. Female condom A female condom is a soft, loose-fitting sheath that is put into the vagina before sex. The condom keeps sperm from going inside a woman's body. Theyshould be thrown away after one use. Diaphragm A diaphragm is a soft, dome-shaped barrier. It is inserted into the vagina before sex, along with a spermicide. The diaphragm blocks sperm from entering the uterus, and the spermicide kills sperm. A diaphragm should be left in thevagina for 6-8 hours after sex and removed within  24 hours. A diaphragm is prescribed and fitted by a health care provider. A diaphragm should be replaced every 1-2 years, after giving birth, after gaining more than15 lb (6.8 kg), and after pelvic surgery. Cervical cap A cervical cap is a round, soft latex or plastic cup that fits over the cervix. It is inserted into the vagina before sex, along with spermicide. It blocks sperm from entering the uterus. The cap should be left in place for 6-8 hours after sex and removed within 48 hours. A cervical cap must be prescribed andfitted by a health care provider. It should be replaced every 2 years. Sponge A sponge is a soft, circular piece of polyurethane foam with spermicide in it. The sponge helps block sperm from entering the uterus, and the spermicide kills sperm. To use it, you make it wet and then insert it into the vagina. It should be inserted before sex, left in for at least 6 hours after sex, and removed andthrown away within 30 hours. Spermicides Spermicides are chemicals that kill or block sperm from entering the cervix and uterus. They can come as a cream, jelly, suppository, foam, or tablet. A spermicide should be inserted into the vagina with an applicator at least 10-15 minutes before sex to allow time for  it to work. The process must be repeatedevery time you have sex. Spermicides do not require a prescription. Intrauterine contraception Intrauterine device (IUD) An IUD is a T-shaped device that is put in a woman's uterus. There are two types: Hormone IUD.This type contains progestin, a synthetic form of the hormone progesterone. This type can stay in place for 3-5 years. Copper IUD.This type is wrapped in copper wire. It can stay in place for 10 years. Permanent methods of contraception Female tubal ligation In this method, a woman's fallopian tubes are sealed, tied, or blocked duringsurgery to prevent eggs from traveling to the uterus. Hysteroscopic sterilization In this method, a  small, flexible insert is placed into each fallopian tube. The inserts cause scar tissue to form in the fallopian tubes and block them, so sperm cannot reach an egg. The procedure takes about 3 months to be effective.Another form of birth control must be used during those 3 months. Female sterilization This is a procedure to tie off the tubes that carry sperm (vasectomy). After the procedure, the man can still ejaculate fluid (semen). Another form of birth control must be used for 3 months after the procedure. Natural planning methods Natural family planning In this method, a couple does not have sex on days when the woman could become pregnant. Calendar method In this method, the woman keeps track of the length of each menstrual cycle, identifies the days when pregnancy can happen, and does not have sex on those days. Ovulation method In this method, a couple avoids sex during ovulation. Symptothermal method This method involves not having sex during ovulation. The woman typically checks for ovulation bywatching changes in her temperature and in the consistency of cervical mucus. Post-ovulation method In this method, a couple waits to have sex until after ovulation. Where to find more information Centers for Disease Control and Prevention: FootballExhibition.com.br Summary Contraception, also called birth control, refers to methods or devices that prevent pregnancy. Hormonal methods of contraception include implants, injections, pills, patches, vaginal rings, and emergency contraceptives. Barrier methods of contraception can include female condoms, female condoms, diaphragms, cervical caps, sponges, and spermicides. There are two types of IUDs (intrauterine devices). An IUD can be put in a woman's uterus to prevent pregnancy for 3-5 years. Permanent sterilization can be done through a procedure for males and females. Natural family planning methods involve nothaving sex on days when the woman could become  pregnant. This information is not intended to replace advice given to you by your health care provider. Make sure you discuss any questions you have with your healthcare provider. Document Revised: 05/25/2019 Document Reviewed: 05/25/2019 Elsevier Patient Education  2022 Elsevier Inc.       AREA PEDIATRIC/FAMILY PRACTICE PHYSICIANS  [facility] [address] [place], [place] [address] Phone - [phone]   Fax - [phone]  JACK AMOS [address] [place], [place]  [address] Phone - [phone]   Fax - [phone]  [facility] [address] [place], [place]  [address] Phone - [phone]   Fax - [phone]  [facility] [address] [place], [place]  [address] Phone - [phone]   Fax - [phone]  [facility] [address] [place], [place]  [address] Phone - [phone]   Fax - [phone]  [facility] [address] [place], [place]  [address] Phone - [phone]   Fax - [phone]  [facility] [place] [address] [place], [place]  [address] Phone - [phone]   Fax - [phone]  [facility] AT [facility] [address] [place], [address] [place], [place]  [address] Phone - [phone]   Fax - [phone]  [facility] AT [facility] [address] [place], [place]  [address] Phone - [phone]   Fax - [phone] [facility] AT [place] [address] [place], [place]  [address] Phone - [phone]   Fax - [phone]  [facility] [address] [place], [place]  [address] Phone - [phone]   Fax - [phone]  [facility] [address], [address] [place], [place]  [address] Phone - [phone]   Fax - [phone]  [facility] [place] [address] [place], [place]   [address] Phone - [phone]   Fax - [phone]  [facility] [address] [place], [place]  [address] Phone - [phone]  [facility] [address] [place], [place]  [address] Phone - [phone]   Fax - [phone]  [facility] [address] [place], [place]  [address] Phone - [phone]   Fax - [phone]  [facility] [address] [place], [place]  [address] Phone - [phone]   Fax - [phone]  [facility] [address] [place], [place]  [address] Phone - [phone]   Fax - [phone]   [facility] [address] [place], [place]  [address] Phone - [phone]   Fax - [phone]  [facility] [address] [place], [place]  [address] Phone - [phone]   Fax - [phone]  DAVID RUBIN [address] [place], [place]  [address] Phone - [phone]   Fax - [phone]  [facility] [address] [place], [place]  [address] Phone - [phone]   Fax - [phone]  [place] [facility] Alita Chyle [address] [place], [place]  [address] Phone - [phone]   Fax - [phone] Gerarda Fraction [address] [place], [place]  [address] Phone - [phone]   Fax - [phone]  [facility] [address] [place], [place]  [address] Phone - [phone]   Fax - [phone]  [facility] - [place] [address] [place], [place]  [address] Phone - [phone]   Fax - [phone]         Childbirth Education Options: [facility] Department Classes:  Childbirth education classes can help you get ready for a positive parenting experience. You can also meet other expectant parents and get free stuff for your baby. Each class runs for five weeks on the same night and costs $45 for the mother-to-be  and her support person. Medicaid covers the cost if you are eligible. Call [phone] to register. [facility] Childbirth Education:  ([phone] [phone] or 7191950006 or [email or web address]  Baby & Me Class: Discuss newborn & infant parenting and family adjustment issues with other new mothers in a relaxed environment. Each week brings a new speaker or baby-centered activity. We encourage new mothers to join [place] every Thursday at 11:00am. Babies birth until crawling. No registration or fee. Daddy [employer]: This course offers Dads-to-be the tools and knowledge needed to feel confident on their journey to becoming new fathers. Experienced dads, who have been trained as coaches, teach dads-to-be how to hold, comfort, diaper,  swaddle and play with their infant while being able to support the new mom as well. A class for men taught by men. $25/dad Big Brother/Big Sister: Let your children share in the joy of a new brother or sister in this special class designed just for them. Class includes discussion about how families care for babies: swaddling, holding, diapering, safety as well as how they can be helpful in their new role. This class is designed for children ages 2 to 74, but any age is welcome. Please register each child individually. $5/child  Mom Talk: This mom-led group offers support and connection to mothers as they journey through the adjustments and struggles of that sometimes overwhelming first year after the birth of a child. Tuesdays at 10:00am and Thursdays at 6:00pm. Babies welcome. No registration or fee. Breastfeeding Support Group: This group is a mother-to-mother support circle where moms have the opportunity to share their breastfeeding experiences. A Lactation Consultant is present for questions and concerns. Meets each Tuesday at 11:00am. No fee or registration. Breastfeeding Your Baby: Learn what to expect in the first days of breastfeeding your newborn.  This class will help  you feel more confident with the skills needed to begin your breastfeeding experience. Many new mothers are concerned about breastfeeding after leaving the hospital. This class will also address the most common fears and challenges about breastfeeding during the first few weeks, months and beyond. (call for fee) Comfort Techniques and Tour: This 2 hour interactive class will provide you the opportunity to learn & practice hands-on techniques that can help relieve some of the discomfort of labor and encourage your baby to rotate toward the best position for birth. You and your partner will be able to try a variety of labor positions with birth balls and rebozos as well as practice breathing, relaxation, and visualization techniques. A tour of the [facility] is included with this class. $20 per registrant and support person Childbirth Class- Weekend Option: This class is a Weekend version of our Birth & Baby series. It is designed for parents who have a difficult time fitting several weeks of classes into their schedule. It covers the care of your newborn and the basics of labor and childbirth. It also includes a Maternity Care Center Tour of [facility] and lunch. The class is held two consecutive days: beginning on Friday evening from 6:30 - 8:30 p.m. and the next day, Saturday from 9 a.m. - 4 p.m. (call for fee) Linden Dolin Class: Interested in a waterbirth?  This informational class will help you discover whether waterbirth is the right fit for you. Education about waterbirth itself, supplies you would need and how to assemble your support team is what you can expect from this class. Some obstetrical practices require this class in order to pursue a waterbirth. (Not all obstetrical practices offer waterbirth-check with your healthcare provider.) Register only the expectant mom, but you are encouraged to bring your partner to class! Required if planning waterbirth, no  fee. Infant/Child CPR: Parents, grandparents, babysitters, and friends learn Cardio-Pulmonary Resuscitation skills for infants and children. You will also learn how to treat both conscious and unconscious choking in infants and children. This Family & Friends program does not offer certification. Register each participant individually to ensure that enough mannequins are available. (Call for fee) Grandparent Love: Expecting a grandbaby? This class is for you! Learn about the latest infant care and safety recommendations and ways to support your own child as he or she  transitions into the parenting role. Taught by Registered Nurses who are childbirth instructors, but most importantly...they are grandmothers too! $10/person. Childbirth Class- Natural Childbirth: This series of 5 weekly classes is for expectant parents who want to learn and practice natural methods of coping with the process of labor and childbirth. Relaxation, breathing, massage, visualization, role of the partner, and helpful positioning are highlighted. Participants learn how to be confident in their body's ability to give birth. This class will empower and help parents make informed decisions about their own care. Includes discussion that will help new parents transition into the immediate postpartum period. Maternity Care Center Tour of [facility] is included. We suggest taking this class between 25-32 weeks, but it's only a recommendation. $75 per registrant and one support person or $30 Medicaid. Childbirth Class- 3 week Series: This option of 3 weekly classes helps you and your labor partner prepare for childbirth. Newborn care, labor & birth, cesarean birth, pain management, and comfort techniques are discussed and a Maternity Care Center Tour of [facility] is included. The class meets at the same time, on the same day of the week for 3 consecutive weeks beginning with the starting date you choose. $60 for registrant and one  support person.  Marvelous Multiples: Expecting twins, triplets, or more? This class covers the differences in labor, birth, parenting, and breastfeeding issues that face multiples' parents. NICU tour is included. Led by a Certified Childbirth Educator who is the mother of twins. No fee. Caring for Baby: This class is for expectant and adoptive parents who want to learn and practice the most up-to-date newborn care for their babies. Focus is on birth through the first six weeks of life. Topics include feeding, bathing, diapering, crying, umbilical cord care, circumcision care and safe sleep. Parents learn to recognize symptoms of illness and when to call the pediatrician. Register only the mom-to-be and your partner or support person can plan to come with you! $10 per registrant and support person Childbirth Class- online option: This online class offers you the freedom to complete a Birth and Baby series in the comfort of your own home. The flexibility of this option allows you to review sections at your own pace, at times convenient to you and your support people. It includes additional video information, animations, quizzes, and extended activities. Get organized with helpful eClass tools, checklists, and trackers. Once you register online for the class, you will receive an email within a few days to accept the invitation and begin the class when the time is right for you. The content will be available to you for 60 days. $60 for 60 days of online access for you and your support people.

## 2020-06-22 NOTE — Progress Notes (Signed)
Subjective:  Madeline Cooper is a 30 y.o. G1P0 at [redacted]w[redacted]d being seen today for ongoing prenatal care.  She is currently monitored for the following issues for this low-risk pregnancy and has Encounter for supervision of normal first pregnancy in first trimester on their problem list.  Patient reports no complaints.  Contractions: Not present. Vag. Bleeding: None.  Movement: Present. Denies leaking of fluid.   The following portions of the patient's history were reviewed and updated as appropriate: allergies, current medications, past family history, past medical history, past social history, past surgical history and problem list. Problem list updated.  Objective:   Vitals:   06/22/20 0924  BP: 109/75  Pulse: 96  Weight: 213 lb (96.6 kg)    Fetal Status: Fetal Heart Rate (bpm): 145 Fundal Height: 31 cm Movement: Present     General:  Alert, oriented and cooperative. Patient is in no acute distress.  Skin: Skin is warm and dry. No rash noted.   Cardiovascular: Normal heart rate noted  Respiratory: Normal respiratory effort, no problems with respiration noted  Abdomen: Soft, gravid, appropriate for gestational age. Pain/Pressure: Absent     Pelvic: Vag. Bleeding: None     Cervical exam deferred        Extremities: Normal range of motion.     Mental Status: Normal mood and affect. Normal behavior. Normal judgment and thought content.   Urinalysis:      Assessment and Plan:  Pregnancy: G1P0 at [redacted]w[redacted]d  1. Encounter for supervision of normal first pregnancy in first trimester - Glucose Tolerance, 2 Hours w/1 Hour - CBC - HIV Antibody (routine testing w rflx) - RPR - Tdap today - discussed contraception - peds list given - CBE info given - FH today 31 - EFW 90% on [place] 05/27/2020 - growth [place] scheduled 08/05/2020  2. [redacted] weeks gestation of pregnancy   Preterm labor symptoms and general obstetric precautions including but not limited to vaginal bleeding, contractions, leaking of  fluid and fetal movement were reviewed in detail with the patient. I discussed the assessment and treatment plan with the patient. The patient was provided an opportunity to ask questions and all were answered. The patient agreed with the plan and demonstrated an understanding of the instructions. The patient was advised to call back or seek an in-person office evaluation/go to MAU at [facility] for any urgent or concerning symptoms. Please refer to After Visit Summary for other counseling recommendations.  Return in about 2 weeks (around 07/06/2020) for in-person LOB/APP OK.   Merlean Pizzini, Odie Sera, NP

## 2020-06-23 ENCOUNTER — Encounter: Payer: Self-pay | Admitting: Women's Health

## 2020-06-23 ENCOUNTER — Other Ambulatory Visit: Payer: Self-pay

## 2020-06-23 DIAGNOSIS — O99019 Anemia complicating pregnancy, unspecified trimester: Secondary | ICD-10-CM | POA: Insufficient documentation

## 2020-06-23 LAB — COMPREHENSIVE METABOLIC PANEL
ALT: 8 IU/L (ref 0–32)
AST: 11 IU/L (ref 0–40)
Albumin/Globulin Ratio: 1.7 (ref 1.2–2.2)
Albumin: 3.7 g/dL — ABNORMAL LOW (ref 3.9–5.0)
Alkaline Phosphatase: 49 IU/L (ref 44–121)
BUN/Creatinine Ratio: 11 (ref 9–23)
BUN: 5 mg/dL — ABNORMAL LOW (ref 6–20)
Bilirubin Total: 0.2 mg/dL (ref 0.0–1.2)
CO2: 18 mmol/L — ABNORMAL LOW (ref 20–29)
Calcium: 8.9 mg/dL (ref 8.7–10.2)
Chloride: 107 mmol/L — ABNORMAL HIGH (ref 96–106)
Creatinine, Ser: 0.47 mg/dL — ABNORMAL LOW (ref 0.57–1.00)
Globulin, Total: 2.2 g/dL (ref 1.5–4.5)
Glucose: 85 mg/dL (ref 65–99)
Potassium: 4 mmol/L (ref 3.5–5.2)
Sodium: 141 mmol/L (ref 134–144)
Total Protein: 5.9 g/dL — ABNORMAL LOW (ref 6.0–8.5)
eGFR: 132 mL/min/{1.73_m2} (ref >59)

## 2020-06-23 LAB — CBC
Hematocrit: 31.5 % — ABNORMAL LOW (ref 34.0–46.6)
Hemoglobin: 10.5 g/dL — ABNORMAL LOW (ref 11.1–15.9)
MCH: 30 pg (ref 26.6–33.0)
MCHC: 33.3 g/dL (ref 31.5–35.7)
MCV: 90 fL (ref 79–97)
Platelets: 322 10*3/uL (ref 150–450)
RBC: 3.5 x10E6/uL — ABNORMAL LOW (ref 3.77–5.28)
RDW: 13.1 % (ref 11.7–15.4)
WBC: 12.5 10*3/uL — ABNORMAL HIGH (ref 3.4–10.8)

## 2020-06-23 LAB — HIV ANTIBODY (ROUTINE TESTING W REFLEX): HIV Screen 4th Generation wRfx: NONREACTIVE

## 2020-06-23 LAB — PROTEIN / CREATININE RATIO, URINE
Creatinine, Urine: 77.3 mg/dL
Protein, Ur: 23 mg/dL
Protein/Creat Ratio: 298 mg/g creat — ABNORMAL HIGH (ref 0–200)

## 2020-06-23 LAB — GLUCOSE TOLERANCE, 2 HOURS W/ 1HR
Glucose, 1 hour: 133 mg/dL (ref 65–179)
Glucose, 2 hour: 107 mg/dL (ref 65–152)
Glucose, Fasting: 81 mg/dL (ref 65–91)

## 2020-06-23 LAB — RPR: RPR Ser Ql: NONREACTIVE

## 2020-06-27 ENCOUNTER — Telehealth: Payer: Self-pay

## 2020-06-27 NOTE — Telephone Encounter (Signed)
Call patient to inform her of test results. No answer or voice mail to leave a message.  

## 2020-06-27 NOTE — Telephone Encounter (Signed)
-----   Message from Marylen Ponto, NP sent at 06/23/2020  3:30 PM EDT ----- Please call patient to let her know of anemia and please send iron to pharmacy per protocol. Patient does not have access to MyChart.  Thank you, Joni Reining

## 2020-06-29 ENCOUNTER — Other Ambulatory Visit: Payer: Self-pay

## 2020-06-29 DIAGNOSIS — O99019 Anemia complicating pregnancy, unspecified trimester: Secondary | ICD-10-CM

## 2020-06-29 MED ORDER — FERROUS SULFATE 325 (65 FE) MG PO TABS: 325.0000 mg | ORAL_TABLET | Freq: Once | ORAL | 1 refills | Status: DC

## 2020-06-29 NOTE — Progress Notes (Signed)
Rx for iron sent as directed

## 2020-07-06 ENCOUNTER — Ambulatory Visit (INDEPENDENT_AMBULATORY_CARE_PROVIDER_SITE_OTHER): Payer: PRIVATE HEALTH INSURANCE | Admitting: Women's Health

## 2020-07-06 ENCOUNTER — Other Ambulatory Visit: Payer: Self-pay

## 2020-07-06 VITALS — BP 100/65 | HR 90 | Wt 212.4 lb

## 2020-07-06 DIAGNOSIS — Z3A29 29 weeks gestation of pregnancy: Secondary | ICD-10-CM

## 2020-07-06 DIAGNOSIS — O99013 Anemia complicating pregnancy, third trimester: Secondary | ICD-10-CM

## 2020-07-06 DIAGNOSIS — Z3401 Encounter for supervision of normal first pregnancy, first trimester: Secondary | ICD-10-CM

## 2020-07-06 NOTE — Progress Notes (Signed)
Subjective:  Madeline Cooper is a 30 y.o. G1P0 at [redacted]w[redacted]d being seen today for ongoing prenatal care.  She is currently monitored for the following issues for this low-risk pregnancy and has Encounter for supervision of normal first pregnancy in first trimester and Anemia in pregnancy on their problem list.  Patient reports no complaints.  Contractions: Not present. Vag. Bleeding: None.  Movement: Present. Denies leaking of fluid.   The following portions of the patient's history were reviewed and updated as appropriate: allergies, current medications, past family history, past medical history, past social history, past surgical history and problem list. Problem list updated.  Objective:   Vitals:   07/06/20 0826  BP: 100/65  Pulse: 90  Weight: 212 lb 6.4 oz (96.3 kg)    Fetal Status: Fetal Heart Rate (bpm): 147 Fundal Height: 32 cm Movement: Present     General:  Alert, oriented and cooperative. Patient is in no acute distress.  Skin: Skin is warm and dry. No rash noted.   Cardiovascular: Normal heart rate noted  Respiratory: Normal respiratory effort, no problems with respiration noted  Abdomen: Soft, gravid, appropriate for gestational age. Pain/Pressure: Absent     Pelvic: Vag. Bleeding: None     Cervical exam deferred        Extremities: Normal range of motion.     Mental Status: Normal mood and affect. Normal behavior. Normal judgment and thought content.   Urinalysis:      Assessment and Plan:  Pregnancy: G1P0 at [redacted]w[redacted]d  1. Encounter for supervision of normal first pregnancy in first trimester -pt reports difficulty sleeping this past week d/t baby kicking, reports feeling mildly tired during the day, discussed ways to enhance sleep at night including sleep routine  2. Anemia during pregnancy in third trimester -RX iron sent 06/29/2020, instructions given for administration  3. [redacted] weeks gestation of pregnancy  Preterm labor symptoms and general obstetric precautions  including but not limited to vaginal bleeding, contractions, leaking of fluid and fetal movement were reviewed in detail with the patient. I discussed the assessment and treatment plan with the patient. The patient was provided an opportunity to ask questions and all were answered. The patient agreed with the plan and demonstrated an understanding of the instructions. The patient was advised to call back or seek an in-person office evaluation/go to MAU at [facility] for any urgent or concerning symptoms. Please refer to After Visit Summary for other counseling recommendations.  Return in about 2 weeks (around 07/20/2020) for in-person LOB/APP OK.   Epiphany Seltzer, Odie Sera, NP

## 2020-07-06 NOTE — Patient Instructions (Addendum)
Maternity Assessment Unit (MAU)  The Maternity Assessment Unit (MAU) is located at the [facility] and [place] at [facility]. The address is: [address] [place], [place], [place] [address]. Please see map below for additional directions.    The Maternity Assessment Unit is designed to help you during your pregnancy, and for up to 6 weeks after delivery, with any pregnancy- or postpartum-related emergencies, if you think you are in labor, or if your water has broken. For example, if you experience nausea and vomiting, vaginal bleeding, severe abdominal or pelvic pain, elevated blood pressure or other problems related to your pregnancy or postpartum time, please come to the Maternity Assessment Unit for assistance.       Preterm Labor The normal length of a pregnancy is 39-41 weeks. Preterm labor is when labor starts before 37 completed weeks of pregnancy. Babies who are born prematurely and survive may not be fully developed and may be at an increased risk for long-term problems such as cerebral palsy, developmental delays, and vision andhearing problems. Babies who are born too early may have problems soon after birth. Premature babies may have problems regulating blood sugar, body temperature, heart rate, and breathing rate. These babies often have trouble with feeding. The risk ofhaving problems is highest for babies who are born before 39 weeks of pregnancy. What are the causes? The exact cause of this condition is not known. What increases the risk? You are more likely to have preterm labor if you have certain risk factors that relate to your medical history, problems with present and past pregnancies, andlifestyle factors. Medical history You have abnormalities of the uterus, including a short cervix. You have STIs (sexually transmitted infections) or other infections of the urinary tract and the vagina. You have chronic illnesses, such as blood clotting  problems, diabetes, or high blood pressure. You are overweight or underweight. Present and past pregnancies You have had preterm labor before. You are pregnant with twins or other multiples. You have been diagnosed with a condition in which the placenta covers your cervix (placenta previa). You waited less than 18 months between giving birth and becoming pregnant again. Your unborn baby has some abnormalities. You have vaginal bleeding during pregnancy. You became pregnant through in vitro fertilization (IVF). Lifestyle and environmental factors You use tobacco products or drink alcohol. You use drugs. You have stress and no social support. You experience domestic violence. You are exposed to certain chemicals or environmental pollutants. Other factors You are younger than age 94 or older than age 43. What are the signs or symptoms? Symptoms of this condition include: Cramps similar to those that can happen during a menstrual period. The cramps may happen with diarrhea. Pain in the abdomen or lower back. Regular contractions that may feel like tightening of the abdomen. A feeling of increased pressure in the pelvis. Increased watery or bloody mucus discharge from the vagina. Water breaking (ruptured amniotic sac). How is this diagnosed? This condition is diagnosed based on: Your medical history and a physical exam. A pelvic exam. An ultrasound. Monitoring your uterus for contractions. Other tests, including: A swab of the cervix to check for a chemical called fetal fibronectin. Urine tests. How is this treated? Treatment for this condition depends on the length of your pregnancy, your condition, and the health of your baby. Treatment may include: Taking medicines, such as: Hormone medicines. These may be given early in pregnancy to help support the pregnancy. Medicines to stop contractions. Medicines to  help mature the baby's lungs. These may be prescribed if the risk of  delivery is high. Medicines to help protect your baby from brain and nerve complications such as cerebral palsy. Bed rest. If the labor happens before 34 weeks of pregnancy, you may need to stay in the hospital. Delivery of the baby. Follow these instructions at home:  Do not use any products that contain nicotine or tobacco. These products include cigarettes, chewing tobacco, and vaping devices, such as e-cigarettes. If you need help quitting, ask your health care provider. Do not drink alcohol. Take over-the-counter and prescription medicines only as told by your health care provider. Rest as told by your health care provider. Return to your normal activities as told by your health care provider. Ask your health care provider what activities are safe for you. Keep all follow-up visits. This is important. How is this prevented? To increase your chance of having a full-term pregnancy: Do not use drugs or take medicines that have not been prescribed to you during your pregnancy. Talk with your health care provider before taking any herbal supplements, even if you have been taking them regularly. Make sure you gain a healthy amount of weight during your pregnancy. Watch for infection. If you think that you might have an infection, get it checked right away. Symptoms of infection may include: Fever. Abnormal vaginal discharge or discharge that smells bad. Pain or burning with urination. Needing to urinate urgently. Frequently urinating or passing small amounts of urine frequently. Blood in your urine or urine that smells bad or unusual. Where to find more information U.S. Department of Health and Cytogeneticist on Women's Health: http://hoffman.com/ [employer] of Obstetricians and Gynecologists: www.acog.org Centers for Disease Control and Prevention, Preterm Birth: FootballExhibition.com.br Contact a health care provider if: You think you are going into preterm labor. You have signs  or symptoms of preterm labor. You have symptoms of infection. Get help right away if: You are having regular, painful contractions every 5 minutes or less. Your water breaks. Summary Preterm labor is labor that starts before you reach 37 weeks of pregnancy. Delivering your baby early increases your baby's risk of developing long-term problems. You are more likely to have preterm labor if you have certain risk factors that relate to your medical history, problems with present and past pregnancies, and lifestyle factors. Keep all follow-up visits. This is important. Contact a health care provider if you have signs or symptoms of preterm labor. This information is not intended to replace advice given to you by your health care provider. Make sure you discuss any questions you have with your healthcare provider. Document Revised: 12/22/2019 Document Reviewed: 12/22/2019 Elsevier Patient Education  2022 Elsevier Inc.       Pregnancy and Anemia  Anemia is a condition in which there is not enough red blood cells or hemoglobin in the blood. Hemoglobin is a substance in red blood cells that carries oxygen. When you do not have enough red blood cells or hemoglobin (are anemic), your body cannot get enough oxygen and your organs may not work properly. Anemia is common during pregnancy because your body needs more blood volume andblood cells to provide nutrition to the unborn baby. What are the causes? The most common cause of anemia during pregnancy is not having enough iron in the body to make red blood cells (iron deficiency anemia). Other causes may include: Folic acid deficiency. Vitamin B12 deficiency. Certain prescription or over-the-counter medicines. Certain medical conditions or  infections that destroy red blood cells. A low platelet count and bleeding caused by antibodies that go through the placenta to the baby from the mother's blood. What are the signs or symptoms? Mild anemia may  not cause any symptoms. If anemia becomes severe, symptoms may include: Feeling tired or weak. Shortness of breath, especially during activity. Fainting. Pale skin. Headaches. A fast or irregular heartbeat. Dizziness. How is this diagnosed? This condition may be diagnosed based on your medical history and a physicalexam. You may also have blood tests. How is this treated? Treatment for anemia during pregnancy depends on the cause of the anemia. Treatment may include: Making changes to your diet. Taking iron, vitamin B12, or folic acid supplements. Having a blood transfusion. This may be needed if the anemia is severe. Follow these instructions at home: Eating and drinking Follow recommendations from your health care provider about changing your diet. Eat a diet rich in iron. This would include foods such as: Liver. Beef. Eggs. Whole grains. Spinach. Dried fruit. Increase your vitamin C intake. This will help the stomach absorb more iron. Some foods that are high in vitamin C include: Oranges. Peppers. Tomatoes. Mangoes. Eat green leafy vegetables. These are a good source of folic acid. General instructions Take iron supplements and vitamins as told by your health care provider. Keep all follow-up visits. This is important. Contact a health care provider if: You have headaches that happen often or do not go away. You bruise easily. You have a fever. You have nausea and vomiting for more than 24 hours. You are unable to take supplements prescribed to treat your anemia. Get help right away if: You develop signs or symptoms of severe anemia. You have bleeding from your vagina. You develop a rash. You have bloody or tarry stools. You are very dizzy or you faint. Summary Anemia is a condition in which there is not enough red blood cells or hemoglobin in the blood. The most common cause of anemia during pregnancy is not having enough iron in the body to make red blood cells  (iron deficiency anemia). Mild anemia may not cause any symptoms. If it becomes severe, symptoms may include feeling tired and weak. Take iron supplements and vitamins as told by your health care provider. Keep all follow-up visits. This is important. This information is not intended to replace advice given to you by your health care provider. Make sure you discuss any questions you have with your healthcare provider. Document Revised: 04/21/2019 Document Reviewed: 04/21/2019 Elsevier Patient Education  2022 [employer].       Your bloodwork shows that you have anemia. I have sent you a prescription for ferrous sulfate 325mg . Take 1 tablet, every other day. Take iron without food, if possible. If it must be taken with food due to upset stomach, take with orange juice. Avoid taking with milk, cereal, tea, coffee and eggs. Common side effects of iron include: nausea, vomiting gas, constipation, diarrhea and black or green stool. If side effects prevent taking, you may take with orange juice to decrease stomach upset and increase absorption. Please contact our office if you have any questions or concerns. If you have side effects that are intolerable, please contact our office so your medication can be adjusted.                        Safe Medications in Pregnancy    Acne: Benzoyl Peroxide Salicylic Acid  Backache/Headache: Tylenol: 2 regular strength  every 4 hours OR              2 Extra strength every 6 hours  Colds/Coughs/Allergies: Benadryl (alcohol free) 25 mg every 6 hours as needed Breath right strips Claritin Cepacol throat lozenges Chloraseptic throat spray Cold-Eeze- up to three times per day Cough drops, alcohol free Flonase (by prescription only) Guaifenesin Mucinex Robitussin DM (plain only, alcohol free) Saline nasal spray/drops Sudafed (pseudoephedrine) & Actifed ** use only after [redacted] weeks gestation and if you do not have high blood pressure Tylenol Vicks  Vaporub Zinc lozenges Zyrtec   Constipation: Colace Ducolax suppositories Fleet enema Glycerin suppositories Metamucil Milk of magnesia Miralax Senokot Smooth move tea  Diarrhea: Kaopectate Imodium A-D  *NO pepto Bismol  Hemorrhoids: Anusol Anusol HC Preparation H Tucks  Indigestion: Tums Maalox Mylanta Zantac  Pepcid  Insomnia: Benadryl (alcohol free) 25mg  every 6 hours as needed Tylenol PM Unisom, no Gelcaps  Leg Cramps: Tums MagGel  Nausea/Vomiting:  Bonine Dramamine Emetrol Ginger extract Sea bands Meclizine  Nausea medication to take during pregnancy:  Unisom (doxylamine succinate 25 mg tablets) Take one tablet daily at bedtime. If symptoms are not adequately controlled, the dose can be increased to a maximum recommended dose of two tablets daily (1/2 tablet in the morning, 1/2 tablet mid-afternoon and one at bedtime). Vitamin B6 100mg  tablets. Take one tablet twice a day (up to 200 mg per day).  Skin Rashes: Aveeno products Benadryl cream or 25mg  every 6 hours as needed Calamine Lotion 1% cortisone cream  Yeast infection: Gyne-lotrimin 7 Monistat 7   **If taking multiple medications, please check labels to avoid duplicating the same active ingredients **take medication as directed on the label ** Do not exceed 4000 mg of tylenol in 24 hours **Do not take medications that contain aspirin or ibuprofen

## 2020-07-20 ENCOUNTER — Other Ambulatory Visit: Payer: Self-pay

## 2020-07-20 ENCOUNTER — Ambulatory Visit (INDEPENDENT_AMBULATORY_CARE_PROVIDER_SITE_OTHER): Payer: PRIVATE HEALTH INSURANCE | Admitting: Obstetrics

## 2020-07-20 ENCOUNTER — Encounter: Payer: Self-pay | Admitting: Obstetrics

## 2020-07-20 VITALS — BP 112/74 | HR 88 | Wt 216.0 lb

## 2020-07-20 DIAGNOSIS — O99013 Anemia complicating pregnancy, third trimester: Secondary | ICD-10-CM

## 2020-07-20 DIAGNOSIS — Z3401 Encounter for supervision of normal first pregnancy, first trimester: Secondary | ICD-10-CM

## 2020-07-20 NOTE — Progress Notes (Signed)
ROB [redacted]w[redacted]d  CC: None

## 2020-07-20 NOTE — Progress Notes (Signed)
Subjective:  Madeline Cooper is a 30 y.o. G1P0 at [redacted]w[redacted]d being seen today for ongoing prenatal care.  She is currently monitored for the following issues for this low-risk pregnancy and has Encounter for supervision of normal first pregnancy in first trimester and Anemia in pregnancy on their problem list.  Patient reports no complaints.  Contractions: Not present. Vag. Bleeding: None.  Movement: Present. Denies leaking of fluid.   The following portions of the patient's history were reviewed and updated as appropriate: allergies, current medications, past family history, past medical history, past social history, past surgical history and problem list. Problem list updated.  Objective:   Vitals:   07/20/20 0909  BP: 112/74  Pulse: 88  Weight: 216 lb (98 kg)    Fetal Status:     Movement: Present     General:  Alert, oriented and cooperative. Patient is in no acute distress.  Skin: Skin is warm and dry. No rash noted.   Cardiovascular: Normal heart rate noted  Respiratory: Normal respiratory effort, no problems with respiration noted  Abdomen: Soft, gravid, appropriate for gestational age. Pain/Pressure: Absent     Pelvic:  Cervical exam deferred        Extremities: Normal range of motion.  Edema: Trace  Mental Status: Normal mood and affect. Normal behavior. Normal judgment and thought content.   Urinalysis:      Assessment and Plan:  Pregnancy: G1P0 at [redacted]w[redacted]d  1. Encounter for supervision of normal first pregnancy in first trimester  2. Anemia during pregnancy in third trimester - taking iron   Preterm labor symptoms and general obstetric precautions including but not limited to vaginal bleeding, contractions, leaking of fluid and fetal movement were reviewed in detail with the patient. Please refer to After Visit Summary for other counseling recommendations.   Return in about 2 weeks (around 08/03/2020) for ROB.   [name], MD  07/20/20

## 2020-08-03 ENCOUNTER — Other Ambulatory Visit: Payer: Self-pay

## 2020-08-03 ENCOUNTER — Ambulatory Visit (INDEPENDENT_AMBULATORY_CARE_PROVIDER_SITE_OTHER): Payer: PRIVATE HEALTH INSURANCE | Admitting: Women's Health

## 2020-08-03 VITALS — BP 107/66 | HR 84 | Wt 217.0 lb

## 2020-08-03 DIAGNOSIS — Z3A33 33 weeks gestation of pregnancy: Secondary | ICD-10-CM

## 2020-08-03 DIAGNOSIS — O99013 Anemia complicating pregnancy, third trimester: Secondary | ICD-10-CM

## 2020-08-03 DIAGNOSIS — Z3401 Encounter for supervision of normal first pregnancy, first trimester: Secondary | ICD-10-CM

## 2020-08-03 NOTE — Patient Instructions (Addendum)
Maternity Assessment Unit (MAU)  The Maternity Assessment Unit (MAU) is located at the [facility] and [place] at [facility]. The address is: [address] [place], [place], [place] [address]. Please see map below for additional directions.    The Maternity Assessment Unit is designed to help you during your pregnancy, and for up to 6 weeks after delivery, with any pregnancy- or postpartum-related emergencies, if you think you are in labor, or if your water has broken. For example, if you experience nausea and vomiting, vaginal bleeding, severe abdominal or pelvic pain, elevated blood pressure or other problems related to your pregnancy or postpartum time, please come to the Maternity Assessment Unit for assistance.       Preterm Labor The normal length of a pregnancy is 39-41 weeks. Preterm labor is when labor starts before 37 completed weeks of pregnancy. Babies who are born prematurely and survive may not be fully developed and may be at an increased risk for long-term problems such as cerebral palsy, developmental delays, and vision andhearing problems. Babies who are born too early may have problems soon after birth. Premature babies may have problems regulating blood sugar, body temperature, heart rate, and breathing rate. These babies often have trouble with feeding. The risk ofhaving problems is highest for babies who are born before 39 weeks of pregnancy. What are the causes? The exact cause of this condition is not known. What increases the risk? You are more likely to have preterm labor if you have certain risk factors that relate to your medical history, problems with present and past pregnancies, andlifestyle factors. Medical history You have abnormalities of the uterus, including a short cervix. You have STIs (sexually transmitted infections) or other infections of the urinary tract and the vagina. You have chronic illnesses, such as blood clotting  problems, diabetes, or high blood pressure. You are overweight or underweight. Present and past pregnancies You have had preterm labor before. You are pregnant with twins or other multiples. You have been diagnosed with a condition in which the placenta covers your cervix (placenta previa). You waited less than 18 months between giving birth and becoming pregnant again. Your unborn baby has some abnormalities. You have vaginal bleeding during pregnancy. You became pregnant through in vitro fertilization (IVF). Lifestyle and environmental factors You use tobacco products or drink alcohol. You use drugs. You have stress and no social support. You experience domestic violence. You are exposed to certain chemicals or environmental pollutants. Other factors You are younger than age 94 or older than age 43. What are the signs or symptoms? Symptoms of this condition include: Cramps similar to those that can happen during a menstrual period. The cramps may happen with diarrhea. Pain in the abdomen or lower back. Regular contractions that may feel like tightening of the abdomen. A feeling of increased pressure in the pelvis. Increased watery or bloody mucus discharge from the vagina. Water breaking (ruptured amniotic sac). How is this diagnosed? This condition is diagnosed based on: Your medical history and a physical exam. A pelvic exam. An ultrasound. Monitoring your uterus for contractions. Other tests, including: A swab of the cervix to check for a chemical called fetal fibronectin. Urine tests. How is this treated? Treatment for this condition depends on the length of your pregnancy, your condition, and the health of your baby. Treatment may include: Taking medicines, such as: Hormone medicines. These may be given early in pregnancy to help support the pregnancy. Medicines to stop contractions. Medicines to  help mature the baby's lungs. These may be prescribed if the risk of  delivery is high. Medicines to help protect your baby from brain and nerve complications such as cerebral palsy. Bed rest. If the labor happens before 34 weeks of pregnancy, you may need to stay in the hospital. Delivery of the baby. Follow these instructions at home:  Do not use any products that contain nicotine or tobacco. These products include cigarettes, chewing tobacco, and vaping devices, such as e-cigarettes. If you need help quitting, ask your health care provider. Do not drink alcohol. Take over-the-counter and prescription medicines only as told by your health care provider. Rest as told by your health care provider. Return to your normal activities as told by your health care provider. Ask your health care provider what activities are safe for you. Keep all follow-up visits. This is important. How is this prevented? To increase your chance of having a full-term pregnancy: Do not use drugs or take medicines that have not been prescribed to you during your pregnancy. Talk with your health care provider before taking any herbal supplements, even if you have been taking them regularly. Make sure you gain a healthy amount of weight during your pregnancy. Watch for infection. If you think that you might have an infection, get it checked right away. Symptoms of infection may include: Fever. Abnormal vaginal discharge or discharge that smells bad. Pain or burning with urination. Needing to urinate urgently. Frequently urinating or passing small amounts of urine frequently. Blood in your urine or urine that smells bad or unusual. Where to find more information U.S. Department of Health and Cytogeneticist on Women's Health: http://hoffman.com/ [employer] of Obstetricians and Gynecologists: www.acog.org Centers for Disease Control and Prevention, Preterm Birth: FootballExhibition.com.br Contact a health care provider if: You think you are going into preterm labor. You have signs  or symptoms of preterm labor. You have symptoms of infection. Get help right away if: You are having regular, painful contractions every 5 minutes or less. Your water breaks. Summary Preterm labor is labor that starts before you reach 37 weeks of pregnancy. Delivering your baby early increases your baby's risk of developing long-term problems. You are more likely to have preterm labor if you have certain risk factors that relate to your medical history, problems with present and past pregnancies, and lifestyle factors. Keep all follow-up visits. This is important. Contact a health care provider if you have signs or symptoms of preterm labor. This information is not intended to replace advice given to you by your health care provider. Make sure you discuss any questions you have with your healthcare provider. Document Revised: 12/22/2019 Document Reviewed: 12/22/2019 Elsevier Patient Education  2022 [employer].       [email or web address]">  Third Trimester of Pregnancy  The third trimester of pregnancy is from week 28 through week 40. This is months 7 through 9. The third trimester is a time when the unborn baby (fetus) is growing rapidly. At the end of the ninth month, the fetus is about 20inches long and weighs 6-10 pounds. Body changes during your third trimester During the third trimester, your body will continue to go through many changes.The changes vary and generally return to normal after your baby is born. Physical changes Your weight will continue to increase. You can expect to gain 25-35 pounds (11-16 kg) by the end of the pregnancy if you begin pregnancy at a normal weight. If you are underweight, you can expect to gain  28-40 lb (about 13-18 kg), and if you are overweight, you can expect to gain 15-25 lb (about 7-11 kg). You may begin to get stretch marks on your hips, abdomen, and breasts. Your breasts will continue to  grow and may hurt. A yellow fluid (colostrum) may leak from your breasts. This is the first milk you are producing for your baby. You may have changes in your hair. These can include thickening of your hair, rapid growth, and changes in texture. Some people also have hair loss during or after pregnancy, or hair that feels dry or thin. Your belly button may stick out. You may notice more swelling in your hands, face, or ankles. Health changes You may have heartburn. You may have constipation. You may develop hemorrhoids. You may develop swollen, bulging veins (varicose veins) in your legs. You may have increased body aches in the pelvis, back, or thighs. This is due to weight gain and increased hormones that are relaxing your joints. You may have increased tingling or numbness in your hands, arms, and legs. The skin on your abdomen may also feel numb. You may feel short of breath because of your expanding uterus. Other changes You may urinate more often because the fetus is moving lower into your pelvis and pressing on your bladder. You may have more problems sleeping. This may be caused by the size of your abdomen, an increased need to urinate, and an increase in your body's metabolism. You may notice the fetus "dropping," or moving lower in your abdomen (lightening). You may have increased vaginal discharge. You may notice that you have pain around your pelvic bone as your uterus distends. Follow these instructions at home: Medicines Follow your health care provider's instructions regarding medicine use. Specific medicines may be either safe or unsafe to take during pregnancy. Do not take any medicines unless approved by your health care provider. Take a prenatal vitamin that contains at least 600 micrograms (mcg) of folic acid. Eating and drinking Eat a healthy diet that includes fresh fruits and vegetables, whole grains, good sources of protein such as meat, eggs, or tofu, and low-fat dairy  products. Avoid raw meat and unpasteurized juice, milk, and cheese. These carry germs that can harm you and your baby. Eat 4 or 5 small meals rather than 3 large meals a day. You may need to take these actions to prevent or treat constipation: Drink enough fluid to keep your urine pale yellow. Eat foods that are high in fiber, such as beans, whole grains, and fresh fruits and vegetables. Limit foods that are high in fat and processed sugars, such as fried or sweet foods. Activity Exercise only as directed by your health care provider. Most people can continue their usual exercise routine during pregnancy. Try to exercise for 30 minutes at least 5 days a week. Stop exercising if you experience contractions in the uterus. Stop exercising if you develop pain or cramping in the lower abdomen or lower back. Avoid heavy lifting. Do not exercise if it is very hot or humid or if you are at a high altitude. If you choose to, you may continue to have sex unless your health care provider tells you not to. Relieving pain and discomfort Take frequent breaks and rest with your legs raised (elevated) if you have leg cramps or low back pain. Take warm sitz baths to soothe any pain or discomfort caused by hemorrhoids. Use hemorrhoid cream if your health care provider approves. Wear a supportive bra to prevent  discomfort from breast tenderness. If you develop varicose veins: Wear support hose as told by your health care provider. Elevate your feet for 15 minutes, 3-4 times a day. Limit salt in your diet. Safety Talk to your health care provider before traveling far distances. Do not use hot tubs, steam rooms, or saunas. Wear your seat belt at all times when driving or riding in a car. Talk with your health care provider if someone is verbally or physically abusive to you. Preparing for birth To prepare for the arrival of your baby: Take prenatal classes to understand, practice, and ask questions about  labor and delivery. Visit the hospital and tour the maternity area. Purchase a rear-facing car seat and make sure you know how to install it in your car. Prepare the baby's room or sleeping area. Make sure to remove all pillows and stuffed animals from the baby's crib to prevent suffocation. General instructions Avoid cat litter boxes and soil used by cats. These carry germs that can cause birth defects in the baby. If you have a cat, ask someone to clean the litter box for you. Do not douche or use tampons. Do not use scented sanitary pads. Do not use any products that contain nicotine or tobacco, such as cigarettes, e-cigarettes, and chewing tobacco. If you need help quitting, ask your health care provider. Do not use any herbal remedies, illegal drugs, or medicines that were not prescribed to you. Chemicals in these products can harm your baby. Do not drink alcohol. You will have more frequent prenatal exams during the third trimester. During a routine prenatal visit, your health care provider will do a physical exam, perform tests, and discuss your overall health. Keep all follow-up visits. This is important. Where to find more information American Pregnancy Association: americanpregnancy.org [employer] of Obstetricians and Gynecologists: https://www.todd-brady.net/ Office on [employer] Health: MightyReward.co.nz Contact a health care provider if you have: A fever. Mild pelvic cramps, pelvic pressure, or nagging pain in your abdominal area or lower back. Vomiting or diarrhea. Bad-smelling vaginal discharge or foul-smelling urine. Pain when you urinate. A headache that does not go away when you take medicine. Visual changes or see spots in front of your eyes. Get help right away if: Your water breaks. You have regular contractions less than 5 minutes apart. You have spotting or bleeding from your vagina. You have severe abdominal pain. You have difficulty  breathing. You have chest pain. You have fainting spells. You have not felt your baby move for the time period told by your health care provider. You have new or increased pain, swelling, or redness in an arm or leg. Summary The third trimester of pregnancy is from week 28 through week 40 (months 7 through 9). You may have more problems sleeping. This can be caused by the size of your abdomen, an increased need to urinate, and an increase in your body's metabolism. You will have more frequent prenatal exams during the third trimester. Keep all follow-up visits. This is important. This information is not intended to replace advice given to you by your health care provider. Make sure you discuss any questions you have with your healthcare provider. Document Revised: 05/27/2019 Document Reviewed: 04/02/2019 Elsevier Patient Education  2022 Elsevier Inc.       Pregnancy and Anemia  Anemia is a condition in which there is not enough red blood cells or hemoglobin in the blood. Hemoglobin is a substance in red blood cells that carries oxygen. When you do not have  enough red blood cells or hemoglobin (are anemic), your body cannot get enough oxygen and your organs may not work properly. Anemia is common during pregnancy because your body needs more blood volume andblood cells to provide nutrition to the unborn baby. What are the causes? The most common cause of anemia during pregnancy is not having enough iron in the body to make red blood cells (iron deficiency anemia). Other causes may include: Folic acid deficiency. Vitamin B12 deficiency. Certain prescription or over-the-counter medicines. Certain medical conditions or infections that destroy red blood cells. A low platelet count and bleeding caused by antibodies that go through the placenta to the baby from the mother's blood. What are the signs or symptoms? Mild anemia may not cause any symptoms. If anemia becomes severe, symptoms may  include: Feeling tired or weak. Shortness of breath, especially during activity. Fainting. Pale skin. Headaches. A fast or irregular heartbeat. Dizziness. How is this diagnosed? This condition may be diagnosed based on your medical history and a physicalexam. You may also have blood tests. How is this treated? Treatment for anemia during pregnancy depends on the cause of the anemia. Treatment may include: Making changes to your diet. Taking iron, vitamin B12, or folic acid supplements. Having a blood transfusion. This may be needed if the anemia is severe. Follow these instructions at home: Eating and drinking Follow recommendations from your health care provider about changing your diet. Eat a diet rich in iron. This would include foods such as: Liver. Beef. Eggs. Whole grains. Spinach. Dried fruit. Increase your vitamin C intake. This will help the stomach absorb more iron. Some foods that are high in vitamin C include: Oranges. Peppers. Tomatoes. Mangoes. Eat green leafy vegetables. These are a good source of folic acid. General instructions Take iron supplements and vitamins as told by your health care provider. Keep all follow-up visits. This is important. Contact a health care provider if: You have headaches that happen often or do not go away. You bruise easily. You have a fever. You have nausea and vomiting for more than 24 hours. You are unable to take supplements prescribed to treat your anemia. Get help right away if: You develop signs or symptoms of severe anemia. You have bleeding from your vagina. You develop a rash. You have bloody or tarry stools. You are very dizzy or you faint. Summary Anemia is a condition in which there is not enough red blood cells or hemoglobin in the blood. The most common cause of anemia during pregnancy is not having enough iron in the body to make red blood cells (iron deficiency anemia). Mild anemia may not cause any  symptoms. If it becomes severe, symptoms may include feeling tired and weak. Take iron supplements and vitamins as told by your health care provider. Keep all follow-up visits. This is important. This information is not intended to replace advice given to you by your health care provider. Make sure you discuss any questions you have with your healthcare provider. Document Revised: 04/21/2019 Document Reviewed: 04/21/2019 Elsevier Patient Education  2022 Elsevier Inc.       Group B Streptococcus Test During Pregnancy Why am I having this test? Routine testing, also called screening, for group B streptococcus (GBS) is recommended for all pregnant women between the 36th and 37th week of pregnancy. GBS is a type of bacteria that can be passed from mother to baby during childbirth. Screening will help guide whether or not you will need treatment during labor and delivery to  prevent complications such as: An infection in your uterus during labor. An infection in your uterus after delivery. A serious infection in your baby after delivery, such as pneumonia, meningitis, or sepsis. GBS screening is not often done before 36 weeks of pregnancy unless you go intolabor prematurely. What happens if I have group B streptococcus? If testing shows that you have GBS, your health care provider will recommend treatment with IV antibiotics during labor and delivery. This treatmentsignificantly decreases the risk of complications for you and your baby. If you have a planned C-section and you have GBS, you may not need to be treated with antibiotics because GBS is usually passed to babies after labor starts and your water breaks. If you are in labor or your water breaks before your C-section, it is possible for GBS to get into your uterus and be passed toyour baby, so you might need treatment. Is there a chance I may not need to be tested? You may not need to be tested for GBS if: You have a urine test that shows  GBS before 36 to 37 weeks. You had a baby with GBS infection after a previous delivery. In these cases, you will automatically be treated for GBS during labor anddelivery. What is being tested? This test is done to check if you have group B streptococcus in your vagina orrectum. What kind of sample is taken? To collect samples for this test, your health care provider will swab your vagina and rectum with a cotton swab. The sample is then sent to the lab to seeif GBS is present. What happens during the test?  You will remove your clothing from the waist down. You will lie down on an exam table in the same position as you would for a pelvic exam. Your health care provider will swab your vagina and rectum to collect samples for a culture test. You will be able to go home after the test and do all your usual activities. How are the results reported? The test results are reported as positive or negative. What do the results mean? A positive test means you are at risk for passing GBS to your baby during labor and delivery. Your health care provider will recommend that you are treated with an IV antibiotic during labor and delivery. A negative test means you are at very low risk of passing GBS to your baby. There is still a low risk of passing GBS to your baby because sometimes test results may report that you do not have a condition when you do (false-negative result) or there is a chance that you may become infected with GBS after the test is done. You most likely will not need to be treated with an antibiotic during labor and delivery. Talk with your health care provider about what your results mean. Questions to ask your health care provider Ask your health care provider, or the department that is doing the test: When will my results be ready? How will I get my results? What are my treatment options? Summary Routine testing (screening) for group B streptococcus (GBS) is recommended for all  pregnant women between the 36th and 37th week of pregnancy. GBS is a type of bacteria that can be passed from mother to baby during childbirth. If testing shows that you have GBS, your health care provider will recommend that you are treated with IV antibiotics during labor and delivery. This treatment almost always prevents infection in newborns. This information is not intended to replace  advice given to you by your health care provider. Make sure you discuss any questions you have with your healthcare provider. Document Revised: 10/20/2019 Document Reviewed: 01/15/2018 Elsevier Patient Education  2022 [employer].       DOULA LIST   [facility] [employer]  [place]  [phone]  [email or web address]  [email or web address]  [place] the [employer] [phone]  [email or web address]   [employer]   Precious Danford Bad   [email or web address]   ??THE MOTHERLY DOULA?? Zola Button   [phone]   [email or web address]     [facility] [employer] [employer] [facility]  Olive Bass  [phone]    [email or web address]   [facility]  Angie Rosier     [phone]     [email or web address]   Renato Gails: Doula & Photographer   Renato Gails [phone]       [email or web address]  [email or web address]   [employer] [facility]  [place] [phone]   [email or web address] [place], [place]  Lolita Rieger  [phone]  [email or web address]   [email or web address]   [facility]   Kizzie Furnish   [phone]  [email or web address]   Dina Rich [place] Doula  Dina Rich  [phone] [email or web address]  [facility]  Cornelious Bryant         [facility]       Lora Reynolds     [phone] [email or web address]   [email or web address]   [facility]   [place] [phone] [email or web address]   [facility]  Camelia Eng     [phone]  [email or web address]  [facility]     [phone]  [email or web address]  Facebook and IG .Lutricia Horsfall  [phone] [email or web address] .Simeon Craft [phone] [email or web address] .Ozella Almond  [phone] [email or web address]     [facility]  [phone] [email or web address]   IG    Lurline Hare    [phone] [email or web address]   Marcie Bal "[place]" Free  [phone] [email or web address]    Mtende Roll  [phone] [email or web address]   Susie Williams   [email or web address]    Denita Lung    [phone] [email or web address]     Lenard Galloway  [phone] [email or web address] .[email or web address] Neu  [phone] [email or web address]    [place]    [phone]   Baby on the [facility]  [phone] [email or web address]  Doula Mama Maryjean Ka [phone] [email or web address]  [facility] [employer] [facility] [employer] [place]  [phone] [email or web address]  [facility] Enzo Montgomery [phone]  [email or web address]  [email or web address]  [phone] [email or web address]   Ardine Eng [phone] [email or web address]   Gilda Crease [phone] [email or web address]   Jonelle Sports  [phone] [email or web address]  [employer]  Jackelyn Poling  [phone] [email or web address]   Cristina Gong [phone] [email or web address]  MOOR Divine Myeka Dunn  [email or web address]   Glory Buff [phone] [email or web address] Ny [phone] [email or web address]   Eldridge Abrahams [phone] [email or web address]

## 2020-08-03 NOTE — Progress Notes (Signed)
Subjective:  Madeline Cooper is a 30 y.o. G1P0 at [redacted]w[redacted]d being seen today for ongoing prenatal care.  She is currently monitored for the following issues for this low-risk pregnancy and has Encounter for supervision of normal first pregnancy in first trimester and Anemia in pregnancy on their problem list.  Patient reports no complaints.  Contractions: Not present. Vag. Bleeding: None.  Movement: Present. Denies leaking of fluid.   The following portions of the patient's history were reviewed and updated as appropriate: allergies, current medications, past family history, past medical history, past social history, past surgical history and problem list. Problem list updated.  Objective:   Vitals:   08/03/20 0955  BP: 107/66  Pulse: 84  Weight: 217 lb (98.4 kg)    Fetal Status: Fetal Heart Rate (bpm): 145   Movement: Present     General:  Alert, oriented and cooperative. Patient is in no acute distress.  Skin: Skin is warm and dry. No rash noted.   Cardiovascular: Normal heart rate noted  Respiratory: Normal respiratory effort, no problems with respiration noted  Abdomen: Soft, gravid, appropriate for gestational age. Pain/Pressure: Absent     Pelvic: Vag. Bleeding: None     Cervical exam deferred        Extremities: Normal range of motion.  Edema: None  Mental Status: Normal mood and affect. Normal behavior. Normal judgment and thought content.   Urinalysis:      Assessment and Plan:  Pregnancy: G1P0 at [redacted]w[redacted]d  1. Encounter for supervision of normal first pregnancy in first trimester -GBS/cultures next visit -doula resources given  2. Anemia during pregnancy in third trimester -pt on oral iron CBC Latest Ref Rng & Units 06/22/2020 03/01/2020  WBC 3.4 - 10.8 x10E3/uL 12.5(H) 15.6(H)  Hemoglobin 11.1 - 15.9 g/dL 10.5(L) 13.4  Hematocrit 34.0 - 46.6 % 31.5(L) 39.8  Platelets 150 - 450 x10E3/uL 322 354    3. [redacted] weeks gestation of pregnancy  Preterm labor symptoms and general  obstetric precautions including but not limited to vaginal bleeding, contractions, leaking of fluid and fetal movement were reviewed in detail with the patient. I discussed the assessment and treatment plan with the patient. The patient was provided an opportunity to ask questions and all were answered. The patient agreed with the plan and demonstrated an understanding of the instructions. The patient was advised to call back or seek an in-person office evaluation/go to [facility] at [facility] for any urgent or concerning symptoms. Please refer to After Visit Summary for other counseling recommendations.  Return in about 15 days (around 08/18/2020) for in-person LOB/APP OK/GBS/cultures, cannot be scheduled sooner than 8/18.   Rhylee Pucillo, Odie Sera, NP

## 2020-08-03 NOTE — Progress Notes (Signed)
+   Fetal movement. No complaints.  

## 2020-08-05 ENCOUNTER — Ambulatory Visit: Payer: 59 | Attending: Obstetrics and Gynecology

## 2020-08-05 ENCOUNTER — Other Ambulatory Visit: Payer: Self-pay

## 2020-08-05 ENCOUNTER — Other Ambulatory Visit: Payer: Self-pay | Admitting: *Deleted

## 2020-08-05 ENCOUNTER — Ambulatory Visit: Payer: 59 | Admitting: *Deleted

## 2020-08-05 ENCOUNTER — Encounter: Payer: Self-pay | Admitting: *Deleted

## 2020-08-05 VITALS — BP 110/53 | HR 86

## 2020-08-05 DIAGNOSIS — O3663X1 Maternal care for excessive fetal growth, third trimester, fetus 1: Secondary | ICD-10-CM

## 2020-08-05 DIAGNOSIS — Z3401 Encounter for supervision of normal first pregnancy, first trimester: Secondary | ICD-10-CM

## 2020-08-05 DIAGNOSIS — E669 Obesity, unspecified: Secondary | ICD-10-CM

## 2020-08-05 DIAGNOSIS — O99212 Obesity complicating pregnancy, second trimester: Secondary | ICD-10-CM | POA: Insufficient documentation

## 2020-08-05 DIAGNOSIS — Z362 Encounter for other antenatal screening follow-up: Secondary | ICD-10-CM

## 2020-08-05 DIAGNOSIS — O3662X0 Maternal care for excessive fetal growth, second trimester, not applicable or unspecified: Secondary | ICD-10-CM | POA: Diagnosis present

## 2020-08-05 DIAGNOSIS — Z3A34 34 weeks gestation of pregnancy: Secondary | ICD-10-CM

## 2020-08-19 ENCOUNTER — Ambulatory Visit (INDEPENDENT_AMBULATORY_CARE_PROVIDER_SITE_OTHER): Payer: PRIVATE HEALTH INSURANCE | Admitting: Nurse Practitioner

## 2020-08-19 ENCOUNTER — Other Ambulatory Visit (HOSPITAL_COMMUNITY)
Admission: RE | Admit: 2020-08-19 | Discharge: 2020-08-19 | Disposition: A | Discharge status: Home / Self Care | Payer: 59 | Source: Ambulatory Visit | Attending: Nurse Practitioner | Admitting: Nurse Practitioner

## 2020-08-19 ENCOUNTER — Encounter: Payer: Self-pay | Admitting: Nurse Practitioner

## 2020-08-19 ENCOUNTER — Other Ambulatory Visit: Payer: Self-pay

## 2020-08-19 VITALS — BP 119/75 | HR 87 | Wt 219.6 lb

## 2020-08-19 DIAGNOSIS — O3663X0 Maternal care for excessive fetal growth, third trimester, not applicable or unspecified: Secondary | ICD-10-CM

## 2020-08-19 DIAGNOSIS — Z3401 Encounter for supervision of normal first pregnancy, first trimester: Secondary | ICD-10-CM | POA: Insufficient documentation

## 2020-08-19 DIAGNOSIS — Z3A36 36 weeks gestation of pregnancy: Secondary | ICD-10-CM

## 2020-08-19 NOTE — Progress Notes (Signed)
Pt reports fetal movement with occasional pressure.  

## 2020-08-19 NOTE — Progress Notes (Signed)
    Subjective:  Madeline Cooper is a 30 y.o. G1P0 at [redacted]w[redacted]d being seen today for ongoing prenatal care.  She is currently monitored for the following issues for this low-risk pregnancy and has Encounter for supervision of normal first pregnancy in first trimester and Anemia in pregnancy on their problem list.  Patient reports no complaints.  Contractions: Not present. Vag. Bleeding: None.  Movement: Present. Denies leaking of fluid.   The following portions of the patient's history were reviewed and updated as appropriate: allergies, current medications, past family history, past medical history, past social history, past surgical history and problem list. Problem list updated.  Objective:   Vitals:   08/19/20 1013  BP: 119/75  Pulse: 87  Weight: 219 lb 9.6 oz (99.6 kg)    Fetal Status: Fetal Heart Rate (bpm): 138 Fundal Height: 39 cm Movement: Present  Presentation: Vertex  General:  Alert, oriented and cooperative. Patient is in no acute distress.  Skin: Skin is warm and dry. No rash noted.   Cardiovascular: Normal heart rate noted  Respiratory: Normal respiratory effort, no problems with respiration noted  Abdomen: Soft, gravid, appropriate for gestational age. Pain/Pressure: Present     Pelvic:  Cervical exam performed Dilation: Closed Effacement (%): Thick Station: Ballotable vaginal swabs done  Extremities: Normal range of motion.  Edema: Trace  Mental Status: Normal mood and affect. Normal behavior. Normal judgment and thought content.   Urinalysis:      Assessment and Plan:  Pregnancy: G1P0 at [redacted]w[redacted]d  1. Encounter for supervision of normal first pregnancy in first trimester Doing well - baby moving well.  Occasional contractions.  Some pelvic pressure. Discussed increasing pelvic pressure and vaginal discomfort is expected as baby is high today and will be settling into the pelvis.  - Strep Gp B NAA - Cervicovaginal ancillary only( [place])  2. Excessive fetal  growth affecting management of pregnancy in third trimester, single or unspecified fetus Has [place] scheduled in 2 weeks  3. [redacted] weeks gestation of pregnancy   Preterm labor symptoms and general obstetric precautions including but not limited to vaginal bleeding, contractions, leaking of fluid and fetal movement were reviewed in detail with the patient. Please refer to After Visit Summary for other counseling recommendations.  Return in about 1 week (around 08/26/2020) for in person ROB.  Nolene Bernheim, RN, MSN, NP-BC Nurse Practitioner, [facility] for [employer], [facility] 08/19/2020 10:34 AM

## 2020-08-21 LAB — STREP GP B NAA: Strep Gp B NAA: NEGATIVE

## 2020-08-22 LAB — CERVICOVAGINAL ANCILLARY ONLY
Chlamydia: NEGATIVE
Comment: NEGATIVE
Comment: NORMAL
Neisseria Gonorrhea: NEGATIVE

## 2020-08-26 ENCOUNTER — Other Ambulatory Visit: Payer: Self-pay

## 2020-08-26 ENCOUNTER — Ambulatory Visit (INDEPENDENT_AMBULATORY_CARE_PROVIDER_SITE_OTHER): Payer: PRIVATE HEALTH INSURANCE | Admitting: Obstetrics

## 2020-08-26 ENCOUNTER — Encounter: Payer: Self-pay | Admitting: Obstetrics

## 2020-08-26 VITALS — BP 136/92 | HR 94 | Wt 217.0 lb

## 2020-08-26 DIAGNOSIS — O3663X0 Maternal care for excessive fetal growth, third trimester, not applicable or unspecified: Secondary | ICD-10-CM

## 2020-08-26 DIAGNOSIS — Z3400 Encounter for supervision of normal first pregnancy, unspecified trimester: Secondary | ICD-10-CM

## 2020-08-26 DIAGNOSIS — O99013 Anemia complicating pregnancy, third trimester: Secondary | ICD-10-CM

## 2020-08-26 NOTE — Progress Notes (Signed)
Size greater than dates per fundal measurements. [place] scheduled next Friday.  BP 136 92, No headache, blurry vision, dizziness. No RUQ abdomen pain.  Reports itchy all over.

## 2020-08-26 NOTE — Progress Notes (Signed)
Subjective:  Madeline Cooper is a 30 y.o. G1P0 at [redacted]w[redacted]d being seen today for ongoing prenatal care.  She is currently monitored for the following issues for this low-risk pregnancy and has Encounter for supervision of normal first pregnancy in first trimester and Anemia in pregnancy on their problem list.  Patient reports heartburn and occasional pruritis .  Contractions: Not present. Vag. Bleeding: None.  Movement: Present. Denies leaking of fluid.   The following portions of the patient's history were reviewed and updated as appropriate: allergies, current medications, past family history, past medical history, past social history, past surgical history and problem list. Problem list updated.  Objective:   Vitals:   08/26/20 0909  BP: (!) 136/92  Pulse: 94  Weight: 217 lb (98.4 kg)    Fetal Status: Fetal Heart Rate (bpm): 135   Movement: Present     General:  Alert, oriented and cooperative. Patient is in no acute distress.  Skin: Skin is warm and dry. No rash noted.   Cardiovascular: Normal heart rate noted  Respiratory: Normal respiratory effort, no problems with respiration noted  Abdomen: Soft, gravid, appropriate for gestational age. Pain/Pressure: Present     Pelvic:  Cervical exam deferred        Extremities: Normal range of motion.  Edema: None  Mental Status: Normal mood and affect. Normal behavior. Normal judgment and thought content.   Urinalysis:      Assessment and Plan:  Pregnancy: G1P0 at [redacted]w[redacted]d  1. Supervision of normal first pregnancy, antepartum-  2. Excessive fetal growth affecting management of pregnancy in third trimester, single or unspecified fetus - ultrasound for interval scheduled  3. Anemia during pregnancy in third trimester - taking iron   Term labor symptoms and general obstetric precautions including but not limited to vaginal bleeding, contractions, leaking of fluid and fetal movement were reviewed in detail with the patient. Please refer to  After Visit Summary for other counseling recommendations.  Return in about 1 week (around 09/02/2020) for ROB.   [name], MD  08/26/20

## 2020-09-01 ENCOUNTER — Ambulatory Visit (INDEPENDENT_AMBULATORY_CARE_PROVIDER_SITE_OTHER): Payer: PRIVATE HEALTH INSURANCE | Admitting: Obstetrics

## 2020-09-01 ENCOUNTER — Encounter: Payer: Self-pay | Admitting: Obstetrics

## 2020-09-01 ENCOUNTER — Other Ambulatory Visit: Payer: Self-pay

## 2020-09-01 VITALS — BP 118/78 | HR 82 | Wt 221.0 lb

## 2020-09-01 DIAGNOSIS — O3663X0 Maternal care for excessive fetal growth, third trimester, not applicable or unspecified: Secondary | ICD-10-CM

## 2020-09-01 DIAGNOSIS — Z3400 Encounter for supervision of normal first pregnancy, unspecified trimester: Secondary | ICD-10-CM

## 2020-09-01 DIAGNOSIS — O99013 Anemia complicating pregnancy, third trimester: Secondary | ICD-10-CM

## 2020-09-01 NOTE — Progress Notes (Signed)
Pt is having some problems with carpal tunnel.

## 2020-09-01 NOTE — Progress Notes (Signed)
Subjective:  Madeline Cooper is a 30 y.o. G1P0 at [redacted]w[redacted]d being seen today for ongoing prenatal care.  She is currently monitored for the following issues for this low-risk pregnancy and has Encounter for supervision of normal first pregnancy in first trimester and Anemia in pregnancy on their problem list.  Patient reports no complaints.  Contractions: Irregular. Vag. Bleeding: None.  Movement: Present. Denies leaking of fluid.   The following portions of the patient's history were reviewed and updated as appropriate: allergies, current medications, past family history, past medical history, past social history, past surgical history and problem list. Problem list updated.  Objective:   Vitals:   09/01/20 0955  BP: 118/78  Pulse: 82  Weight: 221 lb (100.2 kg)    Fetal Status:     Movement: Present     General:  Alert, oriented and cooperative. Patient is in no acute distress.  Skin: Skin is warm and dry. No rash noted.   Cardiovascular: Normal heart rate noted  Respiratory: Normal respiratory effort, no problems with respiration noted  Abdomen: Soft, gravid, appropriate for gestational age. Pain/Pressure: Present     Pelvic:  Cervical exam deferred        Extremities: Normal range of motion.     Mental Status: Normal mood and affect. Normal behavior. Normal judgment and thought content.   Urinalysis:      Assessment and Plan:  Pregnancy: G1P0 at [redacted]w[redacted]d  1. Supervision of normal first pregnancy, antepartum  2. Excessive fetal growth affecting management of pregnancy in third trimester, single or unspecified fetus - ultrasound tomorrow for interval growth  3. Anemia during pregnancy in third trimester - taking iron   Term labor symptoms and general obstetric precautions including but not limited to vaginal bleeding, contractions, leaking of fluid and fetal movement were reviewed in detail with the patient. Please refer to After Visit Summary for other counseling  recommendations.   Return in about 1 week (around 09/08/2020) for ROB.   [name], MD  09/01/20

## 2020-09-02 ENCOUNTER — Ambulatory Visit: Payer: 59 | Attending: Obstetrics

## 2020-09-02 ENCOUNTER — Ambulatory Visit: Payer: 59 | Admitting: *Deleted

## 2020-09-02 ENCOUNTER — Other Ambulatory Visit: Payer: Self-pay | Admitting: Obstetrics

## 2020-09-02 VITALS — BP 119/68 | HR 77

## 2020-09-02 DIAGNOSIS — O3663X0 Maternal care for excessive fetal growth, third trimester, not applicable or unspecified: Secondary | ICD-10-CM | POA: Diagnosis not present

## 2020-09-02 DIAGNOSIS — O99013 Anemia complicating pregnancy, third trimester: Secondary | ICD-10-CM | POA: Insufficient documentation

## 2020-09-02 DIAGNOSIS — Z3401 Encounter for supervision of normal first pregnancy, first trimester: Secondary | ICD-10-CM

## 2020-09-02 DIAGNOSIS — O3663X1 Maternal care for excessive fetal growth, third trimester, fetus 1: Secondary | ICD-10-CM | POA: Insufficient documentation

## 2020-09-02 DIAGNOSIS — Z3A38 38 weeks gestation of pregnancy: Secondary | ICD-10-CM

## 2020-09-02 DIAGNOSIS — O4103X0 Oligohydramnios, third trimester, not applicable or unspecified: Secondary | ICD-10-CM | POA: Diagnosis not present

## 2020-09-03 ENCOUNTER — Other Ambulatory Visit: Payer: Self-pay | Admitting: Women's Health

## 2020-09-05 ENCOUNTER — Other Ambulatory Visit: Payer: Self-pay | Admitting: Obstetrics

## 2020-09-05 ENCOUNTER — Other Ambulatory Visit: Payer: Self-pay | Admitting: Advanced Practice Midwife

## 2020-09-06 ENCOUNTER — Encounter (HOSPITAL_COMMUNITY): Payer: Self-pay

## 2020-09-06 ENCOUNTER — Telehealth (HOSPITAL_COMMUNITY): Payer: Self-pay | Admitting: *Deleted

## 2020-09-06 ENCOUNTER — Encounter (HOSPITAL_COMMUNITY): Payer: Self-pay | Admitting: *Deleted

## 2020-09-06 NOTE — Telephone Encounter (Signed)
Preadmission screen  

## 2020-09-07 ENCOUNTER — Other Ambulatory Visit: Payer: Self-pay | Admitting: Obstetrics & Gynecology

## 2020-09-07 LAB — SARS CORONAVIRUS 2 (TAT 6-24 HRS): SARS Coronavirus 2: NEGATIVE

## 2020-09-08 ENCOUNTER — Encounter (HOSPITAL_COMMUNITY): Payer: Self-pay | Admitting: Obstetrics & Gynecology

## 2020-09-08 ENCOUNTER — Inpatient Hospital Stay (HOSPITAL_COMMUNITY)
Admission: AD | Admit: 2020-09-08 | Discharge: 2020-09-12 | DRG: 807 | Disposition: A | Discharge status: Home / Self Care | Payer: 59 | Attending: Family Medicine | Admitting: Family Medicine

## 2020-09-08 ENCOUNTER — Inpatient Hospital Stay (HOSPITAL_COMMUNITY): Payer: 59

## 2020-09-08 ENCOUNTER — Other Ambulatory Visit: Payer: Self-pay

## 2020-09-08 ENCOUNTER — Encounter: Payer: PRIVATE HEALTH INSURANCE | Admitting: Obstetrics & Gynecology

## 2020-09-08 DIAGNOSIS — O4103X0 Oligohydramnios, third trimester, not applicable or unspecified: Secondary | ICD-10-CM | POA: Diagnosis present

## 2020-09-08 DIAGNOSIS — Z3A39 39 weeks gestation of pregnancy: Secondary | ICD-10-CM

## 2020-09-08 DIAGNOSIS — O3663X0 Maternal care for excessive fetal growth, third trimester, not applicable or unspecified: Secondary | ICD-10-CM | POA: Diagnosis present

## 2020-09-08 DIAGNOSIS — Z3400 Encounter for supervision of normal first pregnancy, unspecified trimester: Secondary | ICD-10-CM

## 2020-09-08 DIAGNOSIS — Z3480 Encounter for supervision of other normal pregnancy, unspecified trimester: Secondary | ICD-10-CM

## 2020-09-08 LAB — CBC
HCT: 36 % (ref 36.0–46.0)
Hemoglobin: 11.8 g/dL — ABNORMAL LOW (ref 12.0–15.0)
MCH: 29.4 pg (ref 26.0–34.0)
MCHC: 32.8 g/dL (ref 30.0–36.0)
MCV: 89.6 fL (ref 80.0–100.0)
Platelets: 277 10*3/uL (ref 150–400)
RBC: 4.02 MIL/uL (ref 3.87–5.11)
RDW: 15.1 % (ref 11.5–15.5)
WBC: 10.5 10*3/uL (ref 4.0–10.5)
nRBC: 0 % (ref 0.0–0.2)

## 2020-09-08 LAB — TYPE AND SCREEN
ABO/RH(D): B POS
Antibody Screen: NEGATIVE

## 2020-09-08 LAB — RPR: RPR Ser Ql: NONREACTIVE

## 2020-09-08 MED ORDER — LACTATED RINGERS IV SOLN
500.0000 mL | Freq: Once | INTRAVENOUS | Status: DC
Start: 2020-09-08 — End: 2020-09-10

## 2020-09-08 MED ORDER — TERBUTALINE SULFATE 1 MG/ML IJ SOLN: 0.2500 mg | Freq: Once | INTRAMUSCULAR | Status: DC | PRN

## 2020-09-08 MED ORDER — OXYTOCIN-SODIUM CHLORIDE 30-0.9 UT/500ML-% IV SOLN: 1.0000 m[IU]/min | INTRAVENOUS | Status: DC

## 2020-09-08 MED ORDER — EPHEDRINE 5 MG/ML INJ: 10.0000 mg | INTRAVENOUS | Status: DC | PRN

## 2020-09-08 MED ORDER — MISOPROSTOL 25 MCG QUARTER TABLET: 25.0000 ug | ORAL_TABLET | ORAL | Status: DC | PRN

## 2020-09-08 MED ORDER — MISOPROSTOL 50MCG HALF TABLET
ORAL_TABLET | ORAL | Status: AC
  Filled 2020-09-08: qty 1

## 2020-09-08 MED ORDER — FENTANYL-BUPIVACAINE-NACL 0.5-0.125-0.9 MG/250ML-% EP SOLN
12.0000 mL/h | EPIDURAL | Status: DC | PRN
  Administered 2020-09-09: 12 mL/h via EPIDURAL
  Filled 2020-09-08: qty 250

## 2020-09-08 MED ORDER — ZOLPIDEM TARTRATE 5 MG PO TABS: 5.0000 mg | ORAL_TABLET | Freq: Every evening | ORAL | Status: DC | PRN

## 2020-09-08 MED ORDER — MISOPROSTOL 25 MCG QUARTER TABLET
25.0000 ug | ORAL_TABLET | ORAL | Status: DC | PRN
  Administered 2020-09-08 – 2020-09-09 (×3): 25 ug via VAGINAL
  Filled 2020-09-08 (×3): qty 1

## 2020-09-08 MED ORDER — PHENYLEPHRINE 40 MCG/ML (10ML) SYRINGE FOR IV PUSH (FOR BLOOD PRESSURE SUPPORT): 80.0000 ug | PREFILLED_SYRINGE | INTRAVENOUS | Status: DC | PRN

## 2020-09-08 MED ORDER — DIPHENHYDRAMINE HCL 50 MG/ML IJ SOLN
12.5000 mg | INTRAMUSCULAR | Status: DC | PRN
  Administered 2020-09-09: 12.5 mg via INTRAVENOUS
  Filled 2020-09-08: qty 1

## 2020-09-08 MED ORDER — MISOPROSTOL 50MCG HALF TABLET
50.0000 ug | ORAL_TABLET | ORAL | Status: DC
  Administered 2020-09-08 – 2020-09-09 (×4): 50 ug via BUCCAL
  Filled 2020-09-08 (×3): qty 1

## 2020-09-08 NOTE — Progress Notes (Signed)
Madeline Cooper is a 30 y.o. G1P0 at [redacted]w[redacted]d admitted for induction of labor due to oligo and also with LGA fetus.  Subjective: Feeling some irregular contractions. Not sure about what she wants for pain yet once contractions pick up.  Objective: BP 120/75   Pulse (!) 101   Ht 5\' 4"  (1.626 m)   Wt 100.7 kg   LMP 12/10/2019   BMI 38.11 kg/m  No intake/output data recorded. No intake/output data recorded.  FHT:  FHR: 125 bpm, variability: moderate,  accelerations:  Present,  decelerations:  Absent UC:   irregular SVE:   Dilation: Closed Exam by:: victor cresenzo  Labs: Lab Results  Component Value Date   WBC 10.5 09/08/2020   HGB 11.8 (L) 09/08/2020   HCT 36.0 09/08/2020   MCV 89.6 09/08/2020   PLT 277 09/08/2020    Assessment / Plan: Induction of labor due to oligohydramnios s/p Ctyotec#1  Labor:  cervix still closed and posterior, Vaginal cytotec#2 placed and will assess for balloon placement at next check Fetal Wellbeing:  Category I Pain Control:   still deciding I/D:   GBS negative Anticipated MOD:  NSVD  11/08/2020, MD, MPH OB Fellow, Faculty Practice

## 2020-09-08 NOTE — H&P (Addendum)
OBSTETRIC ADMISSION HISTORY AND PHYSICAL  Madeline Cooper is a 30 y.o. female G1P0 with IUP at [redacted]w[redacted]d by 11wk [place] presenting for induction of labor for oligo as well as LGA. She reports +FMs, No LOF, no VB, no blurry vision, headaches or peripheral edema, and RUQ pain.  She plans on breast feeding. She remains undecided on birth control. She received her prenatal care at [facility]   Dating: By 11wk [place] --->  Estimated Date of Delivery: 09/15/20  Sono:   @[redacted]w[redacted]d , CWD, normal anatomy, cephalic presentation, 3934g, EFW  Prenatal History/Complications:  Suspected COVID exposure   Past Medical History: Past Medical History:  Diagnosis Date   Medical history non-contributory     Past Surgical History: Past Surgical History:  Procedure Laterality Date   COSMETIC SURGERY  08/2018   Eyelids   TONSILECTOMY, ADENOIDECTOMY, BILATERAL MYRINGOTOMY AND TUBES  1999   WISDOM TOOTH EXTRACTION  2009    Obstetrical History: OB History     Gravida  1   Para      Term      Preterm      AB      Living  0      SAB      IAB      Ectopic      Multiple      Live Births  0           Social History Social History   Socioeconomic History   Marital status: Single    Spouse name: Not on file   Number of children: Not on file   Years of education: Not on file   Highest education level: Not on file  Occupational History   Not on file  Tobacco Use   Smoking status: Never   Smokeless tobacco: Never  Vaping Use   Vaping Use: Never used  Substance and Sexual Activity   Alcohol use: Not Currently    Comment: last drink December 2021   Drug use: Never   Sexual activity: Yes    Partners: Male    Birth control/protection: None  Other Topics Concern   Not on file  Social History Narrative   Not on file   Social Determinants of Health   Financial Resource Strain: Not on file  Food Insecurity: Not on file  Transportation Needs: Not on file  Physical Activity: Not  on file  Stress: Not on file  Social Connections: Not on file    Family History: Family History  Problem Relation Age of Onset   Diabetes Mother    Hypertension Maternal Grandfather    Dementia Maternal Grandfather     Allergies: No Known Allergies  Medications Prior to Admission  Medication Sig Dispense Refill Last Dose   ferrous sulfate (FERROUSUL) 325 (65 FE) MG tablet Take 1 tablet (325 mg total) by mouth once for 1 dose. One by mouth daily 30 tablet 1    prenatal vitamin w/FE, FA (PRENATAL 1 + 1) 27-1 MG TABS tablet Take 1 tablet by mouth daily at 12 noon.        Review of Systems   All systems reviewed and negative except as stated in HPI  Blood pressure 120/75, pulse (!) 101, height 5\' 4"  (1.626 m), weight 100.7 kg, last menstrual period 12/10/2019. General appearance: alert, cooperative, and no distress Abdomen: soft, non-tender; bowel sounds normal Extremities: Homans sign is negative, no sign of DVT Presentation: cephalic Fetal monitoringBaseline: 140 bpm, Variability: Good {> 6 bpm), Accelerations: positive, and  Decelerations: Absent Uterine activity: irregular Dilation: Closed Exam by:: lee   Prenatal labs: ABO, Rh: --/--/B POS (09/08 0810) Antibody: NEG (09/08 0810) Rubella: 1.95 (03/01 1534) RPR: Non Reactive (06/22 1134)  HBsAg: Negative (03/01 1534)  HIV: Non Reactive (06/22 1134)  GBS: Negative/-- (08/19 1036)  1 hr Glucola normal  Genetic screening  declined  Anatomy US normal  Prenatal Transfer Tool  Maternal Diabetes: No Genetic Screening: Declined Maternal Ultrasounds/Referrals: Other: Oligohydramnios, LGA Fetal Ultrasounds or other Referrals:  None Maternal Substance Abuse:  No Significant Maternal Medications:  None Significant Maternal Lab Results: Group B Strep negative  Results for orders placed or performed during the hospital encounter of 09/08/20 (from the past 24 hour(s))  Type and screen [facility]    Collection Time: 09/08/20  8:10 AM  Result Value Ref Range   ABO/RH(D) B POS    Antibody Screen NEG    Sample Expiration      09/11/2020,2359 Performed at [facility], [address], [place], [place] [address]   CBC   Collection Time: 09/08/20  8:13 AM  Result Value Ref Range   WBC 10.5 4.0 - 10.5 K/uL   RBC 4.02 3.87 - 5.11 MIL/uL   Hemoglobin 11.8 (L) 12.0 - 15.0 g/dL   HCT 16.0 10.9 - 32.3 %   MCV 89.6 80.0 - 100.0 fL   MCH 29.4 26.0 - 34.0 pg   MCHC 32.8 30.0 - 36.0 g/dL   RDW 55.7 32.2 - 02.5 %   Platelets 277 150 - 400 K/uL   nRBC 0.0 0.0 - 0.2 %    Patient Active Problem List   Diagnosis Date Noted   Anemia in pregnancy 06/23/2020   Encounter for supervision of normal first pregnancy in first trimester 02/23/2020    Assessment/Plan:  Madeline Cooper is a 30 y.o. G1P0 at [redacted]w[redacted]d here for induction of labor for oligohydramnios and LGA.  #Labor: Induction started - s/p Cytotec dose x1, will recheck 4 hours after initial Cytotec dose and determine next steps in management and assess for balloon placement. #Pain: Remains undecided but is considering use of nitrous oxide, fentanyl, and epidural  #FWB: Cat 1  #ID:  GBS negative #MOF: Breastfeeding  #MOC: Undecided  #Circ:  Yes #LGA: Most recent ultrasound showed fetus 3934 g (95th percentile)  Lona Millard, Medical Student  09/08/2020, 10:00 AM  Resident Attestation  I saw and evaluated the patient, performing the key elements of the service.I  personally performed or re-performed the history, physical exam, and medical decision making activities of this service and have verified that the service and findings are accurately documented in the student's note. I developed the management plan that is described in the medical student's note, and I agree with the content, with my edits above.    Derrel Nip, PGY3  GME ATTESTATION:  I saw and evaluated the patient. I agree with the findings and the plan of  care as documented in the resident and student's note.  Warner Mccreedy, MD, MPH OB Fellow, Faculty Practice [facility], Center for [facility] 09/08/2020 12:35 PM

## 2020-09-08 NOTE — Progress Notes (Signed)
Madeline Cooper is a 30 y.o. G1P0 at [redacted]w[redacted]d admitted for induction of labor due to oligo and also with LGA fetus.  Subjective: Patient is resting comfortably and has no current concerns.  Objective: BP 116/75   Pulse 78   Ht 5\' 4"  (1.626 m)   Wt 100.7 kg   LMP 12/10/2019   BMI 38.11 kg/m  No intake/output data recorded. No intake/output data recorded.  FHT: 145 bpm, moderate variability, positive accelerations, no decelerations, uterine irritability UC:   irregular SVE:   Dilation: Closed Station: -3 Exam by:: victor Genoa Freyre  Labs: Lab Results  Component Value Date   WBC 10.5 09/08/2020   HGB 11.8 (L) 09/08/2020   HCT 36.0 09/08/2020   MCV 89.6 09/08/2020   PLT 277 09/08/2020    Assessment / Plan: Induction of labor due to oligohydramnios s/p Ctyotec#2  Labor: Cervix remains closed at this exam.  Will give additional Cytotec but transition to buccal 50 mcg.  Will reassess in 4 hours and possibly attempt Foley balloon at that time. Fetal Wellbeing:  Category I Pain Control:   still deciding I/D:   GBS negative Anticipated MOD:  NSVD  11/08/2020, MD  PGY-2, [facility]  09/08/20 5:14 PM

## 2020-09-09 ENCOUNTER — Inpatient Hospital Stay (HOSPITAL_COMMUNITY): Payer: 59 | Admitting: Anesthesiology

## 2020-09-09 ENCOUNTER — Encounter (HOSPITAL_COMMUNITY): Payer: Self-pay | Admitting: Obstetrics & Gynecology

## 2020-09-09 MED ORDER — FENTANYL CITRATE (PF) 100 MCG/2ML IJ SOLN
100.0000 ug | INTRAMUSCULAR | Status: DC | PRN
  Administered 2020-09-09: 100 ug via INTRAVENOUS

## 2020-09-09 MED ORDER — OXYTOCIN-SODIUM CHLORIDE 30-0.9 UT/500ML-% IV SOLN
1.0000 m[IU]/min | INTRAVENOUS | Status: DC
  Administered 2020-09-09: 2 m[IU]/min via INTRAVENOUS
  Filled 2020-09-09: qty 500

## 2020-09-09 MED ORDER — LIDOCAINE HCL (PF) 1 % IJ SOLN
INTRAMUSCULAR | Status: DC | PRN
  Administered 2020-09-09 (×2): 4 mL via EPIDURAL

## 2020-09-09 MED ORDER — FENTANYL CITRATE (PF) 100 MCG/2ML IJ SOLN
INTRAMUSCULAR | Status: AC
  Filled 2020-09-09: qty 2

## 2020-09-09 MED ORDER — LACTATED RINGERS IV SOLN: INTRAVENOUS | Status: DC

## 2020-09-09 NOTE — Progress Notes (Signed)
Samyukta Edrick Oh is a 30 y.o. G1P0 at [redacted]w[redacted]d admitted for induction of labor due to oligohydramnios.  Subjective: Tyneisha is doing well. She is resting comfortably after a dose of IV pain medication.  Objective: BP 129/77   Pulse 86   Temp 98 F (36.7 C)   Resp 14   Ht 5\' 4"  (1.626 m)   Wt 100.7 kg   LMP 12/10/2019   SpO2 100%   BMI 38.11 kg/m  No intake/output data recorded. No intake/output data recorded.  FHT: 135 bpm, moderate variability, +accels, no decels TOCO:  quiet SVE:   Dilation: Fingertip Effacement (%): 50 Station: Ballotable Exam by:: [email or web address], RN  Labs: Lab Results  Component Value Date   WBC 10.5 09/08/2020   HGB 11.8 (L) 09/08/2020   HCT 36.0 09/08/2020   MCV 89.6 09/08/2020   PLT 277 09/08/2020    Assessment / Plan: 11/08/2020 30 y.o. G1P0 at [redacted]w[redacted]d  Labor: s/p Cytotec x4. Plan for foley bulb insertion at next check.  Fetal Wellbeing:  Category I Pain Control:  IV pain meds I/D:  GBS negative Anticipated MOD:  NSVD    [redacted]w[redacted]d, MSN, CNM 09/09/2020, 11:44 AM

## 2020-09-09 NOTE — Progress Notes (Signed)
Vitals:   09/08/20 0805 09/08/20 1700 09/08/20 1940 09/08/20 2132  BP: 120/75 116/75 122/75 134/75  Pulse: (!) 101 78 77 78  Resp:   16 16  Temp:   98.1 F (36.7 C)   TempSrc:   Oral   Weight:      Height:        Temp:  [98.1 F (36.7 C)] 98.1 F (36.7 C) (09/08 1940) Pulse Rate:  [77-101] 78 (09/08 2132) Resp:  [16] 16 (09/08 2132) BP: (116-134)/(75) 134/75 (09/08 2132) Weight:  [100.7 kg] 100.7 kg (09/08 0756) No intake/output data recorded. No intake/output data recorded. No intake or output data in the 24 hours ending 09/09/20 0156   Current Vital Signs 24h Vital Sign Ranges  T 98.1 F (36.7 C) Temp  Avg: 98.1 F (36.7 C)  Min: 98.1 F (36.7 C)  Max: 98.1 F (36.7 C)  BP 134/75 BP  Min: 116/75  Max: 134/75  HR 78 Pulse  Avg: 83.5  Min: 77  Max: 101  RR 16 Resp  Avg: 16  Min: 16  Max: 16  SaO2     No data recorded       24 Hour I/O Current Shift I/O  Time Ins Outs No intake/output data recorded. No intake/output data recorded.   Patient Vitals for the past 12 hrs:  BP Temp Temp src Pulse Resp  09/08/20 2132 134/75 -- -- 78 16  09/08/20 1940 122/75 98.1 F (36.7 C) Oral 77 16  09/08/20 1700 116/75 -- -- 78 --   Not feeling ctx.  Ctx on EFW q 1-2 minutes. FHR Cat 1.  Cx just a dimple/50/ballottable. Vtx confirmed w/US.  25 mcg cytotec placed in post vaginal fornix.

## 2020-09-09 NOTE — Progress Notes (Addendum)
Madeline Cooper is a 30 y.o. G1P0 at [redacted]w[redacted]d  admitted for induction of labor due to Oligo, fetus LGA.  Subjective: Feeling some contractions. Ok with having balloon placed  Objective: BP 129/77   Pulse 86   Temp 98 F (36.7 C)   Resp 14   Ht 5\' 4"  (1.626 m)   Wt 100.7 kg   LMP 12/10/2019   SpO2 100%   BMI 38.11 kg/m  No intake/output data recorded. No intake/output data recorded.  FHT:  FHR: 130 bpm, variability: moderate,  accelerations:  Present,  decelerations:  Absent UC:   irregular, every 2-5 minutes SVE:   Dilation: Fingertip Effacement (%): 50 Station: Ballotable Exam by:: [email or web address], RN  Labs: Lab Results  Component Value Date   WBC 10.5 09/08/2020   HGB 11.8 (L) 09/08/2020   HCT 36.0 09/08/2020   MCV 89.6 09/08/2020   PLT 277 09/08/2020    Assessment / Plan: Induction of labor due to oligo,  s/p cytotecx6, has cooks in place  Labor:  has received cytotec x6, now fingertip, cooks placed - will give another cytotec 11/08/2020 buccal for further ripening Fetal Wellbeing:  Category I Pain Control:  IV pain meds and planning epidural I/D:   GBS negative Anticipated MOD:  NSVD  09/09/2020, 3:53 PM

## 2020-09-09 NOTE — Progress Notes (Signed)
Madeline Cooper is a 30 y.o. G1P0 at [redacted]w[redacted]d by admitted for induction of labor due to [facility].  Subjective: Patient resting comfortably after an epidural. FOB, Gerilyn Pilgrim, and her mother, Luanne Bras.  Objective: BP 130/82   Pulse 83   Temp 98.3 F (36.8 C) (Oral)   Resp 18   Ht 5\' 4"  (1.626 m)   Wt 100.7 kg   LMP 12/10/2019   SpO2 100%   BMI 38.11 kg/m  No intake/output data recorded. No intake/output data recorded.  FHT:  FHR: 140 bpm, variability: moderate,  accelerations:  Present,  decelerations:  Present early variables UC:   regular, every 3-4 minutes SVE:   Dilation: 6 Effacement (%): 80 Station: -2 Exam by:: [email or web address], RN  Labs: Lab Results  Component Value Date   WBC 10.5 09/08/2020   HGB 11.8 (L) 09/08/2020   HCT 36.0 09/08/2020   MCV 89.6 09/08/2020   PLT 277 09/08/2020    Assessment / Plan: Induction of labor due to oligohydramnios,  progressing well on pitocin  Labor: Progressing on Pitocin, will continue to increase pitocin Preeclampsia:   n/a Fetal Wellbeing:  Category I Pain Control:  Epidural I/D:  n/a Anticipated MOD:  NSVD  11/08/2020, CNM 09/09/2020, 10:30 PM PM

## 2020-09-09 NOTE — Anesthesia Procedure Notes (Signed)
Epidural Patient location during procedure: OB Start time: 09/09/2020 9:10 PM End time: 09/09/2020 9:20 PM  Staffing Anesthesiologist: Mal Amabile, MD  Preanesthetic Checklist Completed: patient identified, IV checked, site marked, risks and benefits discussed, surgical consent, monitors and equipment checked, pre-op evaluation and timeout performed  Epidural Patient position: sitting Prep: DuraPrep and site prepped and draped Patient monitoring: continuous pulse ox and blood pressure Approach: midline Location: L3-L4 Injection technique: LOR air  Needle:  Needle type: Tuohy  Needle gauge: 17 G Needle length: 9 cm and 9 Needle insertion depth: 6 cm Catheter type: closed end flexible Catheter size: 19 Gauge Catheter at skin depth: 11 cm Test dose: negative and Other  Assessment Events: blood not aspirated, injection not painful, no injection resistance, no paresthesia and negative IV test  Additional Notes Patient identified. Risks and benefits discussed including failed block, incomplete  Pain control, post dural puncture headache, nerve damage, paralysis, blood pressure Changes, nausea, vomiting, reactions to medications-both toxic and allergic and post Partum back pain. All questions were answered. Patient expressed understanding and wished to proceed. Sterile technique was used throughout procedure. Epidural site was Dressed with sterile barrier dressing. No paresthesias, signs of intravascular injection Or signs of intrathecal spread were encountered.  Patient was more comfortable after the epidural was dosed. Please see RN's note for documentation of vital signs and FHR which are stable. Reason for block:procedure for pain

## 2020-09-09 NOTE — Anesthesia Preprocedure Evaluation (Signed)
Anesthesia Evaluation  Patient identified by MRN, date of birth, ID band Patient awake    Reviewed: Allergy & Precautions, Patient's Chart, lab work & pertinent test results  Airway Mallampati: II  TM Distance: >3 FB Neck ROM: Full    Dental no notable dental hx. (+) Teeth Intact   Pulmonary neg pulmonary ROS,    Pulmonary exam normal breath sounds clear to auscultation       Cardiovascular negative cardio ROS Normal cardiovascular exam Rhythm:Regular Rate:Normal     Neuro/Psych negative neurological ROS  negative psych ROS   GI/Hepatic Neg liver ROS, GERD  ,  Endo/Other  Obesity  Renal/GU negative Renal ROS  negative genitourinary   Musculoskeletal negative musculoskeletal ROS (+)   Abdominal (+) + obese,   Peds  Hematology  (+) anemia ,   Anesthesia Other Findings   Reproductive/Obstetrics (+) Pregnancy                             Anesthesia Physical Anesthesia Plan  ASA: 2  Anesthesia Plan:    Post-op Pain Management:    Induction:   PONV Risk Score and Plan:   Airway Management Planned: Natural Airway  Additional Equipment:   Intra-op Plan:   Post-operative Plan:   Informed Consent: I have reviewed the patients History and Physical, chart, labs and discussed the procedure including the risks, benefits and alternatives for the proposed anesthesia with the patient or authorized representative who has indicated his/her understanding and acceptance.       Plan Discussed with: Anesthesiologist  Anesthesia Plan Comments:         Anesthesia Quick Evaluation

## 2020-09-10 ENCOUNTER — Encounter (HOSPITAL_COMMUNITY): Payer: Self-pay | Admitting: Family Medicine

## 2020-09-10 DIAGNOSIS — O3663X0 Maternal care for excessive fetal growth, third trimester, not applicable or unspecified: Secondary | ICD-10-CM

## 2020-09-10 DIAGNOSIS — O4103X0 Oligohydramnios, third trimester, not applicable or unspecified: Secondary | ICD-10-CM

## 2020-09-10 DIAGNOSIS — Z3A39 39 weeks gestation of pregnancy: Secondary | ICD-10-CM

## 2020-09-10 DIAGNOSIS — Z3480 Encounter for supervision of other normal pregnancy, unspecified trimester: Secondary | ICD-10-CM

## 2020-09-10 MED ORDER — ONDANSETRON HCL 4 MG PO TABS: 4.0000 mg | ORAL_TABLET | ORAL | Status: DC | PRN

## 2020-09-10 MED ORDER — OXYTOCIN BOLUS FROM INFUSION
333.0000 mL | Freq: Once | INTRAVENOUS | Status: AC
  Administered 2020-09-10: 333 mL via INTRAVENOUS

## 2020-09-10 MED ORDER — PRENATAL MULTIVITAMIN CH
1.0000 | ORAL_TABLET | Freq: Every day | ORAL | Status: DC
  Administered 2020-09-10 – 2020-09-11 (×2): 1 via ORAL
  Filled 2020-09-10 (×2): qty 1

## 2020-09-10 MED ORDER — MEDROXYPROGESTERONE ACETATE 150 MG/ML IM SUSP
150.0000 mg | INTRAMUSCULAR | Status: DC | PRN
  Filled 2020-09-10: qty 1

## 2020-09-10 MED ORDER — TETANUS-DIPHTH-ACELL PERTUSSIS 5-2.5-18.5 LF-MCG/0.5 IM SUSY: 0.5000 mL | PREFILLED_SYRINGE | Freq: Once | INTRAMUSCULAR | Status: DC

## 2020-09-10 MED ORDER — OXYTOCIN-SODIUM CHLORIDE 30-0.9 UT/500ML-% IV SOLN
2.5000 [IU]/h | INTRAVENOUS | Status: DC
  Administered 2020-09-10: 2.5 [IU]/h via INTRAVENOUS

## 2020-09-10 MED ORDER — ACETAMINOPHEN 325 MG PO TABS: 650.0000 mg | ORAL_TABLET | ORAL | Status: DC | PRN

## 2020-09-10 MED ORDER — BENZOCAINE-MENTHOL 20-0.5 % EX AERO
1.0000 "application " | INHALATION_SPRAY | CUTANEOUS | Status: DC | PRN
  Administered 2020-09-10 – 2020-09-11 (×2): 1 via TOPICAL
  Filled 2020-09-10 (×2): qty 56

## 2020-09-10 MED ORDER — IBUPROFEN 600 MG PO TABS
600.0000 mg | ORAL_TABLET | Freq: Four times a day (QID) | ORAL | Status: DC
  Administered 2020-09-10 – 2020-09-11 (×7): 600 mg via ORAL
  Filled 2020-09-10 (×8): qty 1

## 2020-09-10 MED ORDER — ONDANSETRON HCL 4 MG/2ML IJ SOLN: 4.0000 mg | INTRAMUSCULAR | Status: DC | PRN

## 2020-09-10 MED ORDER — MEASLES, MUMPS & RUBELLA VAC IJ SOLR: 0.5000 mL | Freq: Once | INTRAMUSCULAR | Status: DC

## 2020-09-10 MED ORDER — COCONUT OIL OIL: 1.0000 "application " | TOPICAL_OIL | Status: DC | PRN

## 2020-09-10 MED ORDER — DIBUCAINE (PERIANAL) 1 % EX OINT: 1.0000 "application " | TOPICAL_OINTMENT | CUTANEOUS | Status: DC | PRN

## 2020-09-10 MED ORDER — SIMETHICONE 80 MG PO CHEW
80.0000 mg | CHEWABLE_TABLET | ORAL | Status: DC | PRN
  Filled 2020-09-10: qty 1

## 2020-09-10 MED ORDER — SENNOSIDES-DOCUSATE SODIUM 8.6-50 MG PO TABS
2.0000 | ORAL_TABLET | Freq: Every day | ORAL | Status: DC
  Administered 2020-09-11: 2 via ORAL
  Filled 2020-09-10: qty 2

## 2020-09-10 MED ORDER — DIPHENHYDRAMINE HCL 25 MG PO CAPS: 25.0000 mg | ORAL_CAPSULE | Freq: Four times a day (QID) | ORAL | Status: DC | PRN

## 2020-09-10 MED ORDER — WITCH HAZEL-GLYCERIN EX PADS: 1.0000 "application " | MEDICATED_PAD | CUTANEOUS | Status: DC | PRN

## 2020-09-10 NOTE — Lactation Note (Signed)
This note was copied from a baby's chart. Lactation Consultation Note  Patient Name: Boy Adalaide Edrick Oh CHENI'D Date: 09/10/2020 Reason for consult: Initial assessment;Term;Primapara;1st time breastfeeding Age:29 hours   P1 mother whose infant is now 46 hours old.  This is a term baby at 39+2 weeks.  Baby was swaddled and asleep in the bassinet when I arrived.  Mother reported another attempt to breast feed but baby remained sleepy.  Reassurance provided.  Encouraged lots of STS and breast massage/hand expression.  Mother will finger feed/spoon feed colostrum drops to baby.  Encouraged to feed 8-12 times/24 hours or sooner if baby shows cues.  Reviewed cues and breast feeding basics.  Suggested mother call her RN/LC for assistance as needed.    Mom made aware of O/P services, breastfeeding support groups, community resources, and our phone # for post-discharge questions.  Mother has a DEBP for home use.  Father present.   Maternal Data Has patient been taught Hand Expression?: Yes  Feeding Mother's Current Feeding Choice: Breast Milk  LATCH Score                    Lactation Tools Discussed/Used    Interventions Interventions: Breast feeding basics reviewed;Education  Discharge Pump: Personal WIC Program: No  Consult Status Consult Status: Follow-up Date: 09/11/20 Follow-up type: In-patient    Calen Geister R Quantae Martel 09/10/2020, 1:51 PM

## 2020-09-10 NOTE — Discharge Summary (Addendum)
Postpartum Discharge Summary     Patient Name: Madeline Cooper DOB: 04/20/1990 MRN: [id number]  Date of admission: 09/08/2020 Delivery date:09/10/2020  Delivering provider: Renard Matter  Date of discharge: 09/12/2020  Admitting diagnosis: Labor and delivery, indication for care [O75.9] Supervision of other normal pregnancy, antepartum [Z34.80] Intrauterine pregnancy: [redacted]w[redacted]d    Secondary diagnosis:  Principal Problem:   Indication for care in labor or delivery Active Problems:   Encounter for supervision of normal first pregnancy   Labor and delivery, indication for care   Supervision of other normal pregnancy, antepartum   Vaginal delivery  Additional problems: None   Discharge diagnosis: Term Pregnancy Delivered                                              Post partum procedures:None Augmentation: Pitocin, Cytotec, and IP Foley Complications: None  Hospital course: Induction of Labor With Vaginal Delivery   30y.o. yo G1P1001 at 356w2das admitted to the hospital 09/08/2020 for induction of labor.  Indication for induction:  oligohydramnios .  Patient had an uncomplicated labor course as follows: Membrane Rupture Time/Date: 9:45 PM ,09/09/2020   Delivery Method:Vaginal, Spontaneous  Episiotomy: None  Lacerations:  2nd degree  Details of delivery can be found in separate delivery note.  Patient had a routine postpartum course. Patient is discharged home 09/12/20.  Newborn Data: Birth date:09/10/2020  Birth time:8:54 AM  Gender:Female  Living status:Living  Apgars:7 ,9  Weight:3756 g   Magnesium Sulfate received: No BMZ received: No Rhophylac:N/A MMR:Yes T-DaP:Given postpartum Flu: N/A Transfusion:No  Physical exam  Vitals:   09/10/20 1728 09/10/20 2055 09/11/20 0056 09/11/20 0510  BP: 104/61 114/81 109/67 101/66  Pulse: 89 85 82 77  Resp: _0 Temp: 98.4 F (36.9 C) 98.3 F (36.8 C) 98.3 F (36.8 C) 98.2 F (36.8 C)  TempSrc:  Oral Oral Oral  SpO2:   97% 100% 98%  Weight:      Height:       General: alert, cooperative, and no distress Lochia: appropriate Uterine Fundus: firm Incision: N/A DVT Evaluation: No evidence of DVT seen on physical exam. Labs: Lab Results  Component Value Date   WBC 10.5 09/08/2020   HGB 11.8 (L) 09/08/2020   HCT 36.0 09/08/2020   MCV 89.6 09/08/2020   PLT 277 09/08/2020   CMP Latest Ref Rng & Units 06/22/2020  Glucose 65 - 99 mg/dL 85  BUN 6 - 20 mg/dL 5(L)  Creatinine 0.57 - 1.00 mg/dL 0.47(L)  Sodium 134 - 144 mmol/L 141  Potassium 3.5 - 5.2 mmol/L 4.0  Chloride 96 - 106 mmol/L 107(H)  CO2 20 - 29 mmol/L 18(L)  Calcium 8.7 - 10.2 mg/dL 8.9  Total Protein 6.0 - 8.5 g/dL 5.9(L)  Total Bilirubin 0.0 - 1.2 mg/dL 0.2  Alkaline Phos 44 - 121 IU/L 49  AST 0 - 40 IU/L 11  ALT 0 - 32 IU/L 8   Edinburgh Score: No flowsheet data found.   After visit meds:  Allergies as of 09/11/2020   No Known Allergies      Medication List     TAKE these medications    ferrous sulfate 325 (65 FE) MG tablet Commonly known as: FerrouSul Take 1 tablet (325 mg total) by mouth once for 1 dose. One by mouth daily   prenatal  vitamin w/FE, FA 27-1 MG Tabs tablet Take 1 tablet by mouth daily at 12 noon.         Discharge home in stable condition Infant Feeding: Breast Infant Disposition:home with mother Discharge instruction: per After Visit Summary and Postpartum booklet. Activity: Advance as tolerated. Pelvic rest for 6 weeks.  Diet: routine diet Future Appointments:No future appointments. Follow up Visit: Message sent to [facility] by Dr. Cy Blamer on 09/10/2020  Please schedule this patient for a In person postpartum visit in 4 weeks with the following provider: Any provider. Additional Postpartum F/U: None   Low risk pregnancy complicated by:  None Delivery mode:  Vaginal, Spontaneous  Anticipated Birth Control:   declines   Mallie Snooks, MSN, CNM Certified Nurse Midwife, [employer]  for [employer], [place] 09/12/20 10:31 AM

## 2020-09-10 NOTE — Lactation Note (Signed)
This note was copied from a baby's chart. Lactation Consultation Note  Patient Name: Madeline Cooper Date: 09/10/2020 Reason for consult: L&D Initial assessment;Term;Primapara;1st time breastfeeding Age:29 hours   L&D Initial Consult:  Visited with family < 1 hour after birth. Mother eating a meal her family prepared.  Baby awake in father's arms.  Attempted to latch, however, he was not interested.  He remained very calm and alert.  No colostrum drops expressed at this time.  Reassured family that lactation services will be available on the M/B unit.  Allowed time for family bonding.  Father and family member present.   Maternal Data    Feeding Mother's Current Feeding Choice: Breast Milk  LATCH Score Latch: Too sleepy or reluctant, no latch achieved, no sucking elicited.  Audible Swallowing: None  Type of Nipple: Everted at rest and after stimulation  Comfort (Breast/Nipple): Soft / non-tender  Hold (Positioning): Assistance needed to correctly position infant at breast and maintain latch.  LATCH Score: 5   Lactation Tools Discussed/Used    Interventions Interventions: Assisted with latch;Skin to skin  Discharge    Consult Status Consult Status: Follow-up from L&D    Madeline Cooper 09/10/2020, 10:08 AM

## 2020-09-10 NOTE — Lactation Note (Signed)
This note was copied from a baby's chart. Lactation Consultation Note  Patient Name: Madeline Cooper Date: 09/10/2020 Reason for consult: Mother's request;Difficult latch;Primapara;1st time breastfeeding Age:29 hours  Follow up with 13 hours old infant. Infant is latched using 20-mm nipple shield upon arrival. Observed a few uncoordinated sucks, no swallows, mother's nipple is flattened. Noted heart-shaped tongue. Infant has not had a void or stool. Mother is open to supplementation, LC shared this information with Madeline Raisin, RN.  Set up DEBP and talked about frequency/care of parts/milk storage.  Feeding plan:  1-Feeding on demand or 8-12 times in 24h period. 2-Pump or hand-express and offer EBM prior to supplementation, if decided. 3-If decided, supplement following guidelines, paced bottle feeding and fullness cues.  4-Encouraged maternal rest, hydration and food intake.   Contact LC as needed for feeds/support/concerns/questions. All questions answered.      Maternal Data Has patient been taught Hand Expression?: Yes Does the patient have breastfeeding experience prior to this delivery?: No  Feeding Mother's Current Feeding Choice: Breast Milk  LATCH Score Latch: Repeated attempts needed to sustain latch, nipple held in mouth throughout feeding, stimulation needed to elicit sucking reflex.  Audible Swallowing: None  Type of Nipple: Everted at rest and after stimulation (short shafted, pliable tissue)  Comfort (Breast/Nipple): Soft / non-tender  Hold (Positioning): Assistance needed to correctly position infant at breast and maintain latch.  LATCH Score: 6   Lactation Tools Discussed/Used Tools: Pump;Flanges;Nipple Shields Nipple shield size: 20 Flange Size: 24 Breast pump type: Double-Electric Breast Pump;Manual Pump Education: Setup, frequency, and cleaning;Milk Storage Reason for Pumping: NS in use, stimulation and supplementation Pumping frequency:  Q3 Pumped volume:  (currently pumping)  Interventions Interventions: Breast feeding basics reviewed;Assisted with latch;Skin to skin;Breast massage;Hand express;Breast compression;Adjust position;Support pillows;Education;DEBP;Hand pump;Expressed milk;Position options  Discharge Pump: Personal  Consult Status Consult Status: Follow-up Date: 09/11/20 Follow-up type: In-patient    Madeline Cooper 09/10/2020, 10:15 PM

## 2020-09-10 NOTE — Lactation Note (Signed)
This note was copied from a baby's chart. Lactation Consultation Note  Patient Name: Madeline Cooper RWERX'V Date: 09/10/2020 Reason for consult: Follow-up assessment Age:29 hours   P1 mother whose infant is now 10 hours old.  This is a term baby at 39+2 weeks.  RN requested latch assistance.  RN had just finished working with mother/baby for approximately 30 minutes with feeding.  Baby remains sleepy.  He was swaddled and asleep in family member's arms when I arrived.  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  Offered to assess, however, father stated he is sleepy and mother desires to wait and allow him time to sleep.  Acknowledged parents' request.  Offered to return for the next feeding.  Parents' appreciative.  RN updated.   Maternal Data Has patient been taught Hand Expression?: Yes  Feeding Mother's Current Feeding Choice: Breast Milk  LATCH Score                    Lactation Tools Discussed/Used    Interventions Interventions: Breast feeding basics reviewed;Education  Discharge Pump: Personal WIC Program: No  Consult Status Consult Status: Follow-up Date: 09/11/20 Follow-up type: In-patient    Daira Hine R Siham Bucaro 09/10/2020, 4:12 PM

## 2020-09-10 NOTE — Progress Notes (Signed)
Madeline Cooper is a 30 y.o. G1P0 at [redacted]w[redacted]d admitted for induction of labor due to Low amniotic fluid. and LGA.  Subjective: Patient resting in a throne position. Epidural managing pain well. Family support at bedside.  Objective: BP 122/77   Pulse 84   Temp 98.4 F (36.9 C) (Oral)   Resp 16   Ht 5\' 4"  (1.626 m)   Wt 100.7 kg   LMP 12/10/2019   SpO2 95%   BMI 38.11 kg/m  No intake/output data recorded. No intake/output data recorded.  FHT:  FHR: 145 bpm, variability: moderate,  accelerations:  Abscent,  decelerations:  Present early UC:   regular, every 2-3 minutes SVE:   Dilation: Lip/rim Effacement (%): 100 Station: 0 Exam by:: [email or web address], CNM Unable to reduce anterior lip with involuntary  Labs: Lab Results  Component Value Date   WBC 10.5 09/08/2020   HGB 11.8 (L) 09/08/2020   HCT 36.0 09/08/2020   MCV 89.6 09/08/2020   PLT 277 09/08/2020    Assessment / Plan: Induction of labor due to oligohydramnios and possible LGA,  progressing well on pitocin  Labor: Progressing on Pitocin Preeclampsia:   n/a Fetal Wellbeing:  Category I Pain Control:  Epidural I/D:  n/a Anticipated MOD:  NSVD  11/08/2020, CNM 09/10/2020, 6:30 AM

## 2020-09-10 NOTE — Anesthesia Postprocedure Evaluation (Signed)
Anesthesia Post Note  Patient: Madeline Cooper  Procedure(s) Performed: AN AD HOC LABOR EPIDURAL     Patient location during evaluation: Mother Baby Anesthesia Type: Epidural Level of consciousness: awake and alert Pain management: pain level controlled Vital Signs Assessment: post-procedure vital signs reviewed and stable Respiratory status: spontaneous breathing, nonlabored ventilation and respiratory function stable Cardiovascular status: stable Postop Assessment: no headache, no backache and epidural receding Anesthetic complications: no   No notable events documented.  Last Vitals:  Vitals:   09/10/20 1147 09/10/20 1256  BP: 112/70 111/82  Pulse: (!) 101 (!) 110  Resp: 20 18  Temp: 37.7 C 37.4 C  SpO2:      Last Pain:  Vitals:   09/10/20 1256  TempSrc:   PainSc: 0-No pain   Pain Goal:                   [facility] Records

## 2020-09-12 MED ORDER — IBUPROFEN 600 MG PO TABS
600.0000 mg | ORAL_TABLET | Freq: Four times a day (QID) | ORAL | 0 refills | Status: DC
Start: 2020-09-12 — End: 2020-10-13

## 2020-09-12 MED ORDER — ACETAMINOPHEN 325 MG PO TABS: 650.0000 mg | ORAL_TABLET | ORAL | 0 refills | Status: AC | PRN

## 2020-09-12 NOTE — Progress Notes (Signed)
Patient ID: Madeline Cooper, female   DOB: 05-26-1990, 30 y.o.   MRN: [id number]   Rounding attempted, Pediatrician at bedside. Will return.  Clayton Bibles, MSN, CNM Certified Nurse Midwife, Biochemist, clinical for [employer], [facility]

## 2020-09-24 ENCOUNTER — Telehealth (HOSPITAL_COMMUNITY): Payer: Self-pay | Admitting: *Deleted

## 2020-09-24 NOTE — Telephone Encounter (Signed)
Patient voiced no questions or concerns regarding her own health. EPDS = 2. Patient voiced no questions or concerns regarding baby at this time. Patient reported infant sleeps in a bassinet on his back. RN reviewed ABCs of safe sleep - patient verbalized understanding. Patient requested RN email information on hospital's virtual postpartum classes and support groups. Email sent. Deforest Hoyles, RN, 09/24/20, [phone].

## 2020-10-13 ENCOUNTER — Other Ambulatory Visit: Payer: Self-pay

## 2020-10-13 ENCOUNTER — Encounter: Payer: Self-pay | Admitting: Obstetrics and Gynecology

## 2020-10-13 ENCOUNTER — Ambulatory Visit (INDEPENDENT_AMBULATORY_CARE_PROVIDER_SITE_OTHER): Payer: 59 | Admitting: Obstetrics and Gynecology

## 2020-10-13 DIAGNOSIS — Z23 Encounter for immunization: Secondary | ICD-10-CM

## 2020-10-13 NOTE — Patient Instructions (Signed)
Health Maintenance, Female Adopting a healthy lifestyle and getting preventive care are important in promoting health and wellness. Ask your health care provider about: The right schedule for you to have regular tests and exams. Things you can do on your own to prevent diseases and keep yourself healthy. What should I know about diet, weight, and exercise? Eat a healthy diet  Eat a diet that includes plenty of vegetables, fruits, low-fat dairy products, and lean protein. Do not eat a lot of foods that are high in solid fats, added sugars, or sodium. Maintain a healthy weight Body mass index (BMI) is used to identify weight problems. It estimates body fat based on height and weight. Your health care provider can help determine your BMI and help you achieve or maintain a healthy weight. Get regular exercise Get regular exercise. This is one of the most important things you can do for your health. Most adults should: Exercise for at least 150 minutes each week. The exercise should increase your heart rate and make you sweat (moderate-intensity exercise). Do strengthening exercises at least twice a week. This is in addition to the moderate-intensity exercise. Spend less time sitting. Even light physical activity can be beneficial. Watch cholesterol and blood lipids Have your blood tested for lipids and cholesterol at 29 years of age, then have this test every 5 years. Have your cholesterol levels checked more often if: Your lipid or cholesterol levels are high. You are older than 29 years of age. You are at high risk for heart disease. What should I know about cancer screening? Depending on your health history and family history, you may need to have cancer screening at various ages. This may include screening for: Breast cancer. Cervical cancer. Colorectal cancer. Skin cancer. Lung cancer. What should I know about heart disease, diabetes, and high blood pressure? Blood pressure and heart  disease High blood pressure causes heart disease and increases the risk of stroke. This is more likely to develop in people who have high blood pressure readings, are of African descent, or are overweight. Have your blood pressure checked: Every 3-5 years if you are 18-39 years of age. Every year if you are 40 years old or older. Diabetes Have regular diabetes screenings. This checks your fasting blood sugar level. Have the screening done: Once every three years after age 40 if you are at a normal weight and have a low risk for diabetes. More often and at a younger age if you are overweight or have a high risk for diabetes. What should I know about preventing infection? Hepatitis B If you have a higher risk for hepatitis B, you should be screened for this virus. Talk with your health care provider to find out if you are at risk for hepatitis B infection. Hepatitis C Testing is recommended for: Everyone born from 1945 through 1965. Anyone with known risk factors for hepatitis C. Sexually transmitted infections (STIs) Get screened for STIs, including gonorrhea and chlamydia, if: You are sexually active and are younger than 29 years of age. You are older than 29 years of age and your health care provider tells you that you are at risk for this type of infection. Your sexual activity has changed since you were last screened, and you are at increased risk for chlamydia or gonorrhea. Ask your health care provider if you are at risk. Ask your health care provider about whether you are at high risk for HIV. Your health care provider may recommend a prescription medicine   to help prevent HIV infection. If you choose to take medicine to prevent HIV, you should first get tested for HIV. You should then be tested every 3 months for as long as you are taking the medicine. Pregnancy If you are about to stop having your period (premenopausal) and you may become pregnant, seek counseling before you get  pregnant. Take 400 to 800 micrograms (mcg) of folic acid every day if you become pregnant. Ask for birth control (contraception) if you want to prevent pregnancy. Osteoporosis and menopause Osteoporosis is a disease in which the bones lose minerals and strength with aging. This can result in bone fractures. If you are 65 years old or older, or if you are at risk for osteoporosis and fractures, ask your health care provider if you should: Be screened for bone loss. Take a calcium or vitamin D supplement to lower your risk of fractures. Be given hormone replacement therapy (HRT) to treat symptoms of menopause. Follow these instructions at home: Lifestyle Do not use any products that contain nicotine or tobacco, such as cigarettes, e-cigarettes, and chewing tobacco. If you need help quitting, ask your health care provider. Do not use street drugs. Do not share needles. Ask your health care provider for help if you need support or information about quitting drugs. Alcohol use Do not drink alcohol if: Your health care provider tells you not to drink. You are pregnant, may be pregnant, or are planning to become pregnant. If you drink alcohol: Limit how much you use to 0-1 drink a day. Limit intake if you are breastfeeding. Be aware of how much alcohol is in your drink. In the [place] one drink equals one 12 oz bottle of beer (355 mL), one 5 oz glass of wine (148 mL), or one 1 oz glass of hard liquor (44 mL). General instructions Schedule regular health, dental, and eye exams. Stay current with your vaccines. Tell your health care provider if: You often feel depressed. You have ever been abused or do not feel safe at home. Summary Adopting a healthy lifestyle and getting preventive care are important in promoting health and wellness. Follow your health care provider's instructions about healthy diet, exercising, and getting tested or screened for diseases. Follow your health care provider's  instructions on monitoring your cholesterol and blood pressure. This information is not intended to replace advice given to you by your health care provider. Make sure you discuss any questions you have with your health care provider. Document Revised: 02/26/2020 Document Reviewed: 12/11/2017 Elsevier Patient Education  2022 [employer]  

## 2020-10-13 NOTE — Progress Notes (Signed)
    Post Partum Visit Note  Madeline Cooper is a 30 y.o. G45P1001 female who presents for a postpartum visit. She is 5 weeks postpartum following a normal spontaneous vaginal delivery.  I have fully reviewed the prenatal and intrapartum course. The delivery was at [redacted]w[redacted]d gestational weeks.  Anesthesia: epidural. Postpartum course has been unremarkable. Baby is doing well. Baby is feeding by both breast and bottle - Gerber Soothe Pro . Bleeding staining only. Bowel function is normal. Bladder function is normal. Patient is not sexually active. Contraception method is  undecided, will use condoms in the interim . Postpartum depression screening: negative.   The pregnancy intention screening data noted above was reviewed. Potential methods of contraception were discussed. The patient elected to proceed with No data recorded.     The following portions of the patient's history were reviewed and updated as appropriate: allergies, current medications, past family history, past medical history, past social history, past surgical history, and problem list.  Review of Systems Pertinent items noted in HPI and remainder of comprehensive ROS otherwise negative.    Objective:  LMP 12/10/2019    General:  alert   Breasts:  No indicated  Lungs: clear to auscultation bilaterally  Heart:  regular rate and rhythm, S1, S2 normal, no murmur, click, rub or gallop  Abdomen: soft, non-tender; bowel sounds normal; no masses,  no organomegaly   Vulva:  not evaluated  Vagina: not evaluated  Cervix:  Not evaluated  Corpus: not examined  Adnexa:  not evaluated  Rectal Exam: Not performed.        Assessment:    Nl postpartum exam. Pap smear not done at today's visit.   Plan:   Essential components of care per ACOG recommendations:  1.  Mood and well being: Patient with negative depression screening today. Reviewed local resources for support.  - Patient does not use tobacco.  - hx of drug use? No     2. Infant care and feeding:  -Patient currently breastmilk feeding? Yes If breastmilk feeding discussed return to work and pumping. If needed, patient was provided letter for work to allow for every 2-3 hr pumping breaks, and to be granted a private location to express breastmilk and refrigerated area to store breastmilk. Reviewed importance of draining breast regularly to support lactation. -Social determinants of health (SDOH) reviewed in EPIC. No concerns  3. Sexuality, contraception and birth spacing - Patient does not want a pregnancy in the next year.  Desired family size is uncertain  - Reviewed forms of contraception in tiered fashion. Patient desired condoms today.   - Discussed birth spacing of 18 months  4. Sleep and fatigue -Encouraged family/partner/community support of 4 hrs of uninterrupted sleep to help with mood and fatigue  5. Physical Recovery  - Discussed patients delivery and complications - Patient had a second degree laceration, perineal healing reviewed. Patient expressed understanding - Patient has urinary incontinence? No - Patient is safe to resume physical and sexual activity  6.  Health Maintenance - Last pap smear done 3/22 and was normal with negative HPV.   7. Chronic Disease - PCP follow up. NA  Nettie Elm, MD Center for [employer], [facility]

## 2021-01-01 NOTE — L&D Delivery Note (Signed)
LABOR COURSE Patient was admitted for IOL 2/2 hx of shoulder dystocia.Her cervix was ripened with Cytotec x 1. She was augmented with AROM and Pitocin.   Delivery Note Called to room and patient was complete and pushing. Head delivered LOT. No nuchal cord present. Shoulder and body delivered in usual fashion. At [place] a viable female was delivered via Vaginal, Spontaneous (Presentation:LOT; LOA).  Infant with spontaneous cry, placed on mother's abdomen, dried and stimulated. Cord clamped x 2 after two-minute delay, and cut by FOB. Cord blood drawn. Placenta delivered spontaneously with gentle cord traction. Appears intact. Fundus firm with massage and Pitocin. Labia, perineum, vagina, and cervix inspected.   APGAR:9,9; weight: pending  .   Cord: 3VC with the following complications:N/A.    Anesthesia: Epidural  Episiotomy: None Lacerations: None Q. Blood Loss (mL): 224  Mom to postpartum.  Baby to Couplet care / Skin to Skin.  Mallie Snooks, CNM 10/11/21 7:47 PM

## 2021-03-01 ENCOUNTER — Ambulatory Visit (INDEPENDENT_AMBULATORY_CARE_PROVIDER_SITE_OTHER): Payer: Medicaid Other | Admitting: *Deleted

## 2021-03-01 ENCOUNTER — Encounter: Payer: Self-pay | Admitting: Obstetrics

## 2021-03-01 ENCOUNTER — Other Ambulatory Visit: Payer: Self-pay

## 2021-03-01 DIAGNOSIS — Z3201 Encounter for pregnancy test, result positive: Secondary | ICD-10-CM

## 2021-03-01 DIAGNOSIS — N912 Amenorrhea, unspecified: Secondary | ICD-10-CM | POA: Diagnosis not present

## 2021-03-01 LAB — POCT URINE PREGNANCY: Preg Test, Ur: POSITIVE — AB

## 2021-03-01 MED ORDER — PRENATAL PLUS 27-1 MG PO TABS: 1.0000 | ORAL_TABLET | Freq: Every day | ORAL | 11 refills | Status: DC

## 2021-03-01 NOTE — Progress Notes (Signed)
[name] presents today for UPT. She has no unusual complaints. ? ?LMP:01/17/21 ?EDD: 10/17/21 ?   ?OBJECTIVE: Appears well, in no apparent distress.  ?OB History   ? ? Gravida  ?1  ? Para  ?1  ? Term  ?1  ? Preterm  ?   ? AB  ?   ? Living  ?1  ?  ? ? SAB  ?   ? IAB  ?   ? Ectopic  ?   ? Multiple  ?0  ? Live Births  ?1  ?   ?  ?  ? ?Home UPT Result:Positive ?In-Office UPT result:Positive ? ?I have reviewed the patient's allergies and medications.  ? ?ASSESSMENT: Positive pregnancy test ? ?PLAN ?Prenatal care to be completed at: [facility] ? ? ?

## 2021-03-15 ENCOUNTER — Encounter: Payer: Self-pay | Admitting: Obstetrics

## 2021-03-15 ENCOUNTER — Ambulatory Visit (INDEPENDENT_AMBULATORY_CARE_PROVIDER_SITE_OTHER): Payer: Medicaid Other

## 2021-03-15 ENCOUNTER — Other Ambulatory Visit: Payer: Self-pay

## 2021-03-15 VITALS — BP 127/78 | HR 69 | Ht 64.0 in | Wt 200.6 lb

## 2021-03-15 DIAGNOSIS — O0993 Supervision of high risk pregnancy, unspecified, third trimester: Secondary | ICD-10-CM | POA: Insufficient documentation

## 2021-03-15 DIAGNOSIS — O3680X0 Pregnancy with inconclusive fetal viability, not applicable or unspecified: Secondary | ICD-10-CM

## 2021-03-15 DIAGNOSIS — Z3481 Encounter for supervision of other normal pregnancy, first trimester: Secondary | ICD-10-CM | POA: Diagnosis not present

## 2021-03-15 DIAGNOSIS — Z3490 Encounter for supervision of normal pregnancy, unspecified, unspecified trimester: Secondary | ICD-10-CM | POA: Insufficient documentation

## 2021-03-15 NOTE — Progress Notes (Cosign Needed)
New OB Intake ? ?I connected with  Ashanta L Xiong-Byrd on 03/15/21 at  3:10 PM EDT by in person Video Visit and verified that I am speaking with the correct person using two identifiers. Nurse is located at [facility] and pt is located at [place]. ? ?I discussed the limitations, risks, security and privacy concerns of performing an evaluation and management service by telephone and the availability of in person appointments. I also discussed with the patient that there may be a patient responsible charge related to this service. The patient expressed understanding and agreed to proceed. ? ?I explained I am completing New OB Intake today. We discussed her EDD of 10/18/21 that is based on LMP of 01/11/21. Pt is G2/P1001. I reviewed her allergies, medications, Medical/Surgical/OB history, and appropriate screenings. I informed her of [facility] services. Based on history, this is a/an  pregnancy uncomplicated .  ? ?Patient Active Problem List  ? Diagnosis Date Noted  ? Postpartum care following vaginal delivery 10/13/2020  ? ? ?Concerns addressed today ? ?Delivery Plans:  ?Plans to deliver at [facility].  ? ?MyChart/Babyscripts ?MyChart access verified. I explained pt will have some visits in office and some virtually. Babyscripts instructions given and order placed. Patient verifies receipt of registration text/e-mail. Account successfully created and app downloaded. ? ?Blood Pressure Cuff  ?Patient still has BP cuff at home from last pregnancy.  ? ?Weight scale: Patient does have weight scale.  ? ?Anatomy US ?Explained first scheduled [place] will be around 19 weeks. Dating and viability scan performed today. ? ?Labs ?Discussed Avelina Laine genetic screening with patient. Would like both Panorama and Horizon drawn at new OB visit.Also if interested in genetic testing, tell patient she will need AFP 15-21 weeks to complete genetic testing .Routine prenatal labs needed. ? ?Covid Vaccine ?Patient has covid vaccine.  ? ?  ?Is patient  interested in [place]? Yes  "Interested in [facility] - Schedule next visit with CNM" on sticky note ? ?Informed patient of [facility] website  and placed link in her AVS.  ? ?Social Determinants of Health ?Food Insecurity: Patient denies food insecurity. ?WIC Referral: Patient is interested in referral to [facility].  ?Transportation: Patient denies transportation needs. ?Childcare: Discussed no children allowed at ultrasound appointments. Offered childcare services; patient declines childcare services at this time. ? ?Send link to Pregnancy Navigators ? ? ?Placed OB Box on problem list and updated ? ?First visit review ?I reviewed new OB appt with pt. I explained she will have a pelvic exam, ob bloodwork with genetic screening, and PAP smear. Explained pt will be seen by Nettie Elm at first visit; encounter routed to appropriate provider. Explained that patient will be seen by pregnancy navigator following visit with provider. [facility] information placed in AVS.  ? ?Hamilton Capri, RN ?03/15/2021  3:05 PM  ?

## 2021-03-15 NOTE — Progress Notes (Signed)
Patient was assessed and managed by nursing staff during this encounter. I have reviewed the chart and agree with the documentation and plan. I have also made any necessary editorial changes. ? ?Griffin Basil, MD ?03/15/2021 3:57 PM   ?

## 2021-04-12 ENCOUNTER — Other Ambulatory Visit (HOSPITAL_COMMUNITY)
Admission: RE | Admit: 2021-04-12 | Discharge: 2021-04-12 | Disposition: A | Discharge status: Home / Self Care | Payer: Medicaid Other | Source: Ambulatory Visit | Attending: Obstetrics and Gynecology | Admitting: Obstetrics and Gynecology

## 2021-04-12 ENCOUNTER — Ambulatory Visit (INDEPENDENT_AMBULATORY_CARE_PROVIDER_SITE_OTHER): Payer: Medicaid Other | Admitting: Obstetrics and Gynecology

## 2021-04-12 ENCOUNTER — Encounter: Payer: Self-pay | Admitting: Obstetrics and Gynecology

## 2021-04-12 VITALS — BP 120/81 | HR 93 | Wt 197.7 lb

## 2021-04-12 DIAGNOSIS — Z3482 Encounter for supervision of other normal pregnancy, second trimester: Secondary | ICD-10-CM | POA: Insufficient documentation

## 2021-04-12 DIAGNOSIS — Z3A13 13 weeks gestation of pregnancy: Secondary | ICD-10-CM | POA: Diagnosis not present

## 2021-04-12 NOTE — Progress Notes (Signed)
Subjective:  ?Madeline Cooper is a 31 y.o. G2P1001 at [redacted]w[redacted]d being seen today for her first OB visit. EDD by LMP and confirmed by first trimester U/S. H/O TSVD without problems. No chronic medications or medical problems.   She is currently monitored for the following issues for this low-risk pregnancy and has Supervision of normal pregnancy on their problem list. ? ?Patient reports no complaints.  Contractions: Not present. Vag. Bleeding: None.  Movement: Present. Denies leaking of fluid.  ? ?The following portions of the patient's history were reviewed and updated as appropriate: allergies, current medications, past family history, past medical history, past social history, past surgical history and problem list. Problem list updated. ? ?Objective:  ? ?Vitals:  ? 04/12/21 1501  ?BP: 120/81  ?Pulse: 93  ?Weight: 197 lb 11.2 oz (89.7 kg)  ? ? ?Fetal Status: Fetal Heart Rate (bpm): 160   Movement: Present    ? ?General:  Alert, oriented and cooperative. Patient is in no acute distress.  ?Skin: Skin is warm and dry. No rash noted.   ?Cardiovascular: Normal heart rate noted  ?Respiratory: Normal respiratory effort, no problems with respiration noted  ?Abdomen: Soft, gravid, appropriate for gestational age. Pain/Pressure: Absent     ?Pelvic:  Cervical exam deferred        ?Extremities: Normal range of motion.  Edema: None  ?Mental Status: Normal mood and affect. Normal behavior. Normal judgment and thought content.  ? ?Urinalysis:     ? ?Assessment and Plan:  ?Pregnancy: G2P1001 at [redacted]w[redacted]d ? ?1. Encounter for supervision of other normal pregnancy in second trimester ?Prenatal care and labs reviewed with pt ?Genetic testing discussed ? ? ?Preterm labor symptoms and general obstetric precautions including but not limited to vaginal bleeding, contractions, leaking of fluid and fetal movement were reviewed in detail with the patient. ?Please refer to After Visit Summary for other counseling recommendations.  ?Return in  about 4 weeks (around 05/10/2021) for OB visit, face to face, any provider. ? ? ?Hermina Staggers, MD ?

## 2021-04-12 NOTE — Patient Instructions (Signed)
First Trimester of Pregnancy °The first trimester of pregnancy starts on the first day of your last menstrual period until the end of week 12. This is months 1 through 3 of pregnancy. A week after a sperm fertilizes an egg, the egg will implant into the wall of the uterus and begin to develop into a baby. By the end of 12 weeks, all the baby's organs will be formed and the baby will be 2-3 inches in size. °Body changes during your first trimester °Your body goes through many changes during pregnancy. The changes vary and generally return to normal after your baby is born. °Physical changes °You may gain or lose weight. °Your breasts may begin to grow larger and become tender. The tissue that surrounds your nipples (areola) may become darker. °Dark spots or blotches (chloasma or mask of pregnancy) may develop on your face. °You may have changes in your hair. These can include thickening or thinning of your hair or changes in texture. °Health changes °You may feel nauseous, and you may vomit. °You may have heartburn. °You may develop headaches. °You may develop constipation. °Your gums may bleed and may be sensitive to brushing and flossing. °Other changes °You may tire easily. °You may urinate more often. °Your menstrual periods will stop. °You may have a loss of appetite. °You may develop cravings for certain kinds of food. °You may have changes in your emotions from day to day. °You may have more vivid and strange dreams. °Follow these instructions at home: °Medicines °Follow your health care provider's instructions regarding medicine use. Specific medicines may be either safe or unsafe to take during pregnancy. Do not take any medicines unless told to by your health care provider. °Take a prenatal vitamin that contains at least 600 micrograms (mcg) of folic acid. °Eating and drinking °Eat a healthy diet that includes fresh fruits and vegetables, whole grains, good sources of protein such as meat, eggs, or tofu,  and low-fat dairy products. °Avoid raw meat and unpasteurized juice, milk, and cheese. These carry germs that can harm you and your baby. °If you feel nauseous or you vomit: °Eat 4 or 5 small meals a day instead of 3 large meals. °Try eating a few soda crackers. °Drink liquids between meals instead of during meals. °You may need to take these actions to prevent or treat constipation: °Drink enough fluid to keep your urine pale yellow. °Eat foods that are high in fiber, such as beans, whole grains, and fresh fruits and vegetables. °Limit foods that are high in fat and processed sugars, such as fried or sweet foods. °Activity °Exercise only as directed by your health care provider. Most people can continue their usual exercise routine during pregnancy. Try to exercise for 30 minutes at least 5 days a week. °Stop exercising if you develop pain or cramping in the lower abdomen or lower back. °Avoid exercising if it is very hot or humid or if you are at high altitude. °Avoid heavy lifting. °If you choose to, you may have sex unless your health care provider tells you not to. °Relieving pain and discomfort °Wear a good support bra to relieve breast tenderness. °Rest with your legs elevated if you have leg cramps or low back pain. °If you develop bulging veins (varicose veins) in your legs: °Wear support hose as told by your health care provider. °Elevate your feet for 15 minutes, 3-4 times a day. °Limit salt in your diet. °Safety °Wear your seat belt at all times when driving   or riding in a car. °Talk with your health care provider if someone is verbally or physically abusive to you. °Talk with your health care provider if you are feeling sad or have thoughts of hurting yourself. °Lifestyle °Do not use hot tubs, steam rooms, or saunas. °Do not douche. Do not use tampons or scented sanitary pads. °Do not use herbal remedies, alcohol, illegal drugs, or medicines that are not approved by your health care provider. Chemicals  in these products can harm your baby. °Do not use any products that contain nicotine or tobacco, such as cigarettes, e-cigarettes, and chewing tobacco. If you need help quitting, ask your health care provider. °Avoid cat litter boxes and soil used by cats. These carry germs that can cause birth defects in the baby and possibly loss of the unborn baby (fetus) by miscarriage or stillbirth. °General instructions °During routine prenatal visits in the first trimester, your health care provider will do a physical exam, perform necessary tests, and ask you how things are going. Keep all follow-up visits. This is important. °Ask for help if you have counseling or nutritional needs during pregnancy. Your health care provider can offer advice or refer you to specialists for help with various needs. °Schedule a dentist appointment. At home, brush your teeth with a soft toothbrush. Floss gently. °Write down your questions. Take them to your prenatal visits. °Where to find more information °American Pregnancy Association: americanpregnancy.org °American College of Obstetricians and Gynecologists: acog.org/en/Womens%20Health/Pregnancy °Office on Women's Health: womenshealth.gov/pregnancy °Contact a health care provider if you have: °Dizziness. °A fever. °Mild pelvic cramps, pelvic pressure, or nagging pain in the abdominal area. °Nausea, vomiting, or diarrhea that lasts for 24 hours or longer. °A bad-smelling vaginal discharge. °Pain when you urinate. °Known exposure to a contagious illness, such as chickenpox, measles, Zika virus, HIV, or hepatitis. °Get help right away if you have: °Spotting or bleeding from your vagina. °Severe abdominal cramping or pain. °Shortness of breath or chest pain. °Any kind of trauma, such as from a fall or a car crash. °New or increased pain, swelling, or redness in an arm or leg. °Summary °The first trimester of pregnancy starts on the first day of your last menstrual period until the end of week  12 (months 1 through 3). °Eating 4 or 5 small meals a day rather than 3 large meals may help to relieve nausea and vomiting. °Do not use any products that contain nicotine or tobacco, such as cigarettes, e-cigarettes, and chewing tobacco. If you need help quitting, ask your health care provider. °Keep all follow-up visits. This is important. °This information is not intended to replace advice given to you by your health care provider. Make sure you discuss any questions you have with your health care provider. °Document Revised: 05/27/2019 Document Reviewed: 04/02/2019 °Elsevier Patient Education © 2022 Elsevier Inc. ° °

## 2021-04-13 ENCOUNTER — Other Ambulatory Visit: Payer: Self-pay | Admitting: *Deleted

## 2021-04-13 DIAGNOSIS — Z3482 Encounter for supervision of other normal pregnancy, second trimester: Secondary | ICD-10-CM

## 2021-04-13 LAB — CBC/D/PLT+RPR+RH+ABO+RUBIGG...
Antibody Screen: NEGATIVE
Basophils Absolute: 0.1 10*3/uL (ref 0.0–0.2)
Basos: 0 %
EOS (ABSOLUTE): 0.3 10*3/uL (ref 0.0–0.4)
Eos: 2 %
HCV Ab: NONREACTIVE
HIV Screen 4th Generation wRfx: NONREACTIVE
Hematocrit: 37.2 % (ref 34.0–46.6)
Hemoglobin: 12.4 g/dL (ref 11.1–15.9)
Hepatitis B Surface Ag: NEGATIVE
Immature Grans (Abs): 0 10*3/uL (ref 0.0–0.1)
Immature Granulocytes: 0 %
Lymphocytes Absolute: 2.3 10*3/uL (ref 0.7–3.1)
Lymphs: 16 %
MCH: 30.2 pg (ref 26.6–33.0)
MCHC: 33.3 g/dL (ref 31.5–35.7)
MCV: 91 fL (ref 79–97)
Monocytes Absolute: 1.2 10*3/uL — ABNORMAL HIGH (ref 0.1–0.9)
Monocytes: 8 %
Neutrophils Absolute: 10.4 10*3/uL — ABNORMAL HIGH (ref 1.4–7.0)
Neutrophils: 74 %
Platelets: 329 10*3/uL (ref 150–450)
RBC: 4.1 x10E6/uL (ref 3.77–5.28)
RDW: 12.4 % (ref 11.7–15.4)
RPR Ser Ql: NONREACTIVE
Rh Factor: POSITIVE
Rubella Antibodies, IGG: 1.68 index (ref >0.99)
WBC: 14.3 10*3/uL — ABNORMAL HIGH (ref 3.4–10.8)

## 2021-04-13 LAB — HCV INTERPRETATION

## 2021-04-13 LAB — CERVICOVAGINAL ANCILLARY ONLY
Chlamydia: NEGATIVE
Comment: NEGATIVE
Comment: NEGATIVE
Comment: NORMAL
Neisseria Gonorrhea: NEGATIVE
Trichomonas: NEGATIVE

## 2021-04-13 NOTE — Progress Notes (Signed)
U/s order changed to detail.   

## 2021-04-14 LAB — URINE CULTURE

## 2021-04-17 ENCOUNTER — Encounter: Payer: Self-pay | Admitting: Obstetrics and Gynecology

## 2021-05-01 ENCOUNTER — Encounter: Payer: Self-pay | Admitting: Obstetrics and Gynecology

## 2021-05-24 ENCOUNTER — Ambulatory Visit (INDEPENDENT_AMBULATORY_CARE_PROVIDER_SITE_OTHER): Payer: Medicaid Other | Admitting: Obstetrics and Gynecology

## 2021-05-24 ENCOUNTER — Encounter: Payer: Self-pay | Admitting: Obstetrics and Gynecology

## 2021-05-24 VITALS — BP 115/76 | HR 87 | Wt 198.7 lb

## 2021-05-24 DIAGNOSIS — Z3482 Encounter for supervision of other normal pregnancy, second trimester: Secondary | ICD-10-CM

## 2021-05-24 DIAGNOSIS — Z3A19 19 weeks gestation of pregnancy: Secondary | ICD-10-CM

## 2021-05-24 NOTE — Progress Notes (Signed)
Pt presents for ROB visit. Pt has concerns about lifting over 20lbs during pregnancy. No other concerns today.

## 2021-05-24 NOTE — Progress Notes (Signed)
   PRENATAL VISIT NOTE  Subjective:  Allice L Xiong-Byrd is a 31 y.o. G2P1001 at [redacted]w[redacted]d being seen today for ongoing prenatal care.  She is currently monitored for the following issues for this low-risk pregnancy and has Supervision of normal pregnancy on their problem list.  Patient doing well with no acute concerns today. She reports no complaints.  Contractions: Not present. Vag. Bleeding: None.  Movement: Present. Denies leaking of fluid. Discussed lifting light weight with activities of daily living, pt reassured she can lift 20 pounds if comfortable.  The following portions of the patient's history were reviewed and updated as appropriate: allergies, current medications, past family history, past medical history, past social history, past surgical history and problem list. Problem list updated.  Objective:   Vitals:   05/24/21 1550  BP: 115/76  Pulse: 87  Weight: 198 lb 11.2 oz (90.1 kg)    Fetal Status: Fetal Heart Rate (bpm): 146 Fundal Height: 19 cm Movement: Present     General:  Alert, oriented and cooperative. Patient is in no acute distress.  Skin: Skin is warm and dry. No rash noted.   Cardiovascular: Normal heart rate noted  Respiratory: Normal respiratory effort, no problems with respiration noted  Abdomen: Soft, gravid, appropriate for gestational age.  Pain/Pressure: Absent     Pelvic: Cervical exam deferred        Extremities: Normal range of motion.  Edema: None  Mental Status:  Normal mood and affect. Normal behavior. Normal judgment and thought content.   Assessment and Plan:  Pregnancy: G2P1001 at [redacted]w[redacted]d  1. Encounter for supervision of other normal pregnancy in second trimester Continue routine prenatal care - AFP, Serum, Open Spina Bifida  2. [redacted] weeks gestation of pregnancy   Preterm labor symptoms and general obstetric precautions including but not limited to vaginal bleeding, contractions, leaking of fluid and fetal movement were reviewed in detail with  the patient.  Please refer to After Visit Summary for other counseling recommendations.   Return in about 4 weeks (around 06/21/2021) for LOB, virtual.   Mariel Aloe, MD Faculty Attending Center for [facility]

## 2021-05-25 ENCOUNTER — Other Ambulatory Visit: Payer: Self-pay | Admitting: *Deleted

## 2021-05-25 ENCOUNTER — Ambulatory Visit: Payer: Medicaid Other | Attending: Obstetrics and Gynecology

## 2021-05-25 ENCOUNTER — Ambulatory Visit: Payer: Medicaid Other | Admitting: *Deleted

## 2021-05-25 VITALS — BP 110/63 | HR 71

## 2021-05-25 DIAGNOSIS — Z3482 Encounter for supervision of other normal pregnancy, second trimester: Secondary | ICD-10-CM | POA: Insufficient documentation

## 2021-05-25 DIAGNOSIS — Z363 Encounter for antenatal screening for malformations: Secondary | ICD-10-CM | POA: Diagnosis not present

## 2021-05-25 DIAGNOSIS — O99212 Obesity complicating pregnancy, second trimester: Secondary | ICD-10-CM | POA: Insufficient documentation

## 2021-05-25 DIAGNOSIS — Z362 Encounter for other antenatal screening follow-up: Secondary | ICD-10-CM

## 2021-05-25 DIAGNOSIS — Z3A19 19 weeks gestation of pregnancy: Secondary | ICD-10-CM | POA: Diagnosis not present

## 2021-05-26 LAB — AFP, SERUM, OPEN SPINA BIFIDA
AFP MoM: 0.74
AFP Value: 31.7 ng/mL
Gest. Age on Collection Date: 19 weeks
Maternal Age At EDD: 30.6 yr
OSBR Risk 1 IN: 10000
Test Results:: NEGATIVE
Weight: 198 [lb_av]

## 2021-06-21 ENCOUNTER — Telehealth (INDEPENDENT_AMBULATORY_CARE_PROVIDER_SITE_OTHER): Payer: Medicaid Other | Admitting: Student

## 2021-06-21 ENCOUNTER — Encounter: Payer: Medicaid Other | Admitting: Obstetrics

## 2021-06-21 DIAGNOSIS — Z3482 Encounter for supervision of other normal pregnancy, second trimester: Secondary | ICD-10-CM

## 2021-06-21 DIAGNOSIS — Z3A23 23 weeks gestation of pregnancy: Secondary | ICD-10-CM

## 2021-06-21 NOTE — Progress Notes (Signed)
I connected with Madeline Cooper 06/21/21 at 10:35 AM EDT by: MyChart video and verified that I am speaking with the correct person using two identifiers.  Patient is located at work and provider is located in [place] office.     I discussed the limitations, risks, security and privacy concerns of performing an evaluation and management service by MyChart video and the availability of in person appointments. I also discussed with the patient that there may be a patient responsible charge related to this service. By engaging in this virtual visit, you consent to the provision of healthcare.  Additionally, you authorize for your insurance to be billed for the services provided during this visit.  The patient expressed understanding and agreed to proceed.  The following staff members participated in the virtual visit:  Corlis Hove, NP    PRENATAL VISIT NOTE  Subjective:  Madeline Cooper is a 31 y.o. G2P1001 at [redacted]w[redacted]d  for virtual visit for ongoing prenatal care.  She is currently monitored for the following issues for this low-risk pregnancy and has Supervision of normal pregnancy on their problem list.  Patient reports no complaints.  Contractions: Not present. Vag. Bleeding: None.  Movement: Present. Denies leaking of fluid.   The following portions of the patient's history were reviewed and updated as appropriate: allergies, current medications, past family history, past medical history, past social history, past surgical history and problem list.   Objective:  There were no vitals filed for this visit. Self-Obtained  Fetal Status:     Movement: Present     Assessment and Plan:  Pregnancy: G2P1001 at [redacted]w[redacted]d 1. Encounter for supervision of other normal pregnancy in second trimester -Feeling good, no complaints, good fetal movement -Preterm labor precautions reviewed -Continue routine prenatal care  2. [redacted] weeks gestation of pregnancy - Anticipatory guidance, plan for 28 week visit  lab  Preterm labor symptoms and general obstetric precautions including but not limited to vaginal bleeding, contractions, leaking of fluid and fetal movement were reviewed in detail with the patient.  Return in about 4 weeks (around 07/19/2021) for LOB/GTT, IN-PERSON.  Future Appointments  Date Time Provider Department Center  06/23/2021  9:30 AM [facility] NURSE [facility]  06/23/2021  9:45 AM [facility]6 [facility]     Time spent on virtual visit: 20 minutes  Corlis Hove, NP

## 2021-06-21 NOTE — Progress Notes (Signed)
I connected with pt via telephone for virtual ROB visit. Pt does not have any concerns at this time.

## 2021-06-23 ENCOUNTER — Encounter: Payer: Self-pay | Admitting: *Deleted

## 2021-06-23 ENCOUNTER — Ambulatory Visit: Payer: Medicaid Other | Attending: Obstetrics

## 2021-06-23 ENCOUNTER — Ambulatory Visit: Payer: Medicaid Other | Admitting: *Deleted

## 2021-06-23 VITALS — BP 111/54 | HR 74

## 2021-06-23 DIAGNOSIS — Z362 Encounter for other antenatal screening follow-up: Secondary | ICD-10-CM

## 2021-06-23 DIAGNOSIS — O99212 Obesity complicating pregnancy, second trimester: Secondary | ICD-10-CM

## 2021-06-23 DIAGNOSIS — E669 Obesity, unspecified: Secondary | ICD-10-CM

## 2021-06-23 DIAGNOSIS — Z3A23 23 weeks gestation of pregnancy: Secondary | ICD-10-CM

## 2021-07-19 ENCOUNTER — Other Ambulatory Visit: Payer: Medicaid Other

## 2021-07-19 ENCOUNTER — Ambulatory Visit (INDEPENDENT_AMBULATORY_CARE_PROVIDER_SITE_OTHER): Payer: Medicaid Other | Admitting: Obstetrics & Gynecology

## 2021-07-19 VITALS — BP 109/72 | HR 92 | Wt 204.0 lb

## 2021-07-19 DIAGNOSIS — Z3482 Encounter for supervision of other normal pregnancy, second trimester: Secondary | ICD-10-CM | POA: Diagnosis not present

## 2021-07-19 DIAGNOSIS — Z23 Encounter for immunization: Secondary | ICD-10-CM

## 2021-07-19 DIAGNOSIS — Z3A27 27 weeks gestation of pregnancy: Secondary | ICD-10-CM | POA: Diagnosis not present

## 2021-07-19 DIAGNOSIS — Z3480 Encounter for supervision of other normal pregnancy, unspecified trimester: Secondary | ICD-10-CM

## 2021-07-19 NOTE — Progress Notes (Signed)
ROB c/o bloody BM. TDap given in LD, tolerated well

## 2021-07-19 NOTE — Progress Notes (Deleted)
   PRENATAL VISIT NOTE  Subjective:  Madeline Cooper is a 31 y.o. G2P1001 at [redacted]w[redacted]d being seen today for ongoing prenatal care.  She is currently monitored for the following issues for this low-risk pregnancy and has Supervision of normal pregnancy on their problem list.  Patient reports  mild hemorrhoid .  Contractions: Irritability. Vag. Bleeding: None.  Movement: Present. Denies leaking of fluid.   The following portions of the patient's history were reviewed and updated as appropriate: allergies, current medications, past family history, past medical history, past social history, past surgical history and problem list.   Objective:   Vitals:   07/19/21 0951  BP: 109/72  Pulse: 92  Weight: 204 lb (92.5 kg)    Fetal Status: Fetal Heart Rate (bpm): 152   Movement: Present     General:  Alert, oriented and cooperative. Patient is in no acute distress.  Skin: Skin is warm and dry. No rash noted.   Cardiovascular: Normal heart rate noted  Respiratory: Normal respiratory effort, no problems with respiration noted  Abdomen: Soft, gravid, appropriate for gestational age.  Pain/Pressure: Present     Pelvic: {Blank single:19197::"Cervical exam performed in the presence of a chaperone","Cervical exam deferred"}        Extremities: Normal range of motion.  Edema: None  Mental Status: Normal mood and affect. Normal behavior. Normal judgment and thought content.   Assessment and Plan:  Pregnancy: G2P1001 at [redacted]w[redacted]d 1. Supervision of other normal pregnancy, antepartum *** - Glucose Tolerance, 2 Hours w/1 Hour - RPR - CBC - HIV antibody (with reflex) - Tdap vaccine greater than or equal to 7yo IM  2. [redacted] weeks gestation of pregnancy *** - Glucose Tolerance, 2 Hours w/1 Hour - RPR - CBC - HIV antibody (with reflex) - Tdap vaccine greater than or equal to 7yo IM  {Blank single:19197::"Term","Preterm"} labor symptoms and general obstetric precautions including but not limited to  vaginal bleeding, contractions, leaking of fluid and fetal movement were reviewed in detail with the patient. Please refer to After Visit Summary for other counseling recommendations.   Return in about 3 weeks (around 08/09/2021).  No future appointments.  Scheryl Darter, MD

## 2021-07-19 NOTE — Progress Notes (Signed)
   PRENATAL VISIT NOTE  Subjective:  Madeline Cooper is a 31 y.o. G2P1001 at [redacted]w[redacted]d being seen today for ongoing prenatal care.  She is currently monitored for the following issues for this low-risk pregnancy and has Supervision of normal pregnancy on their problem list.  Patient reports hemorrhoid .  Contractions: Irritability. Vag. Bleeding: None.  Movement: Present. Denies leaking of fluid.   The following portions of the patient's history were reviewed and updated as appropriate: allergies, current medications, past family history, past medical history, past social history, past surgical history and problem list.   Objective:   Vitals:   07/19/21 0951  BP: 109/72  Pulse: 92  Weight: 204 lb (92.5 kg)    Fetal Status: Fetal Heart Rate (bpm): 152   Movement: Present     General:  Alert, oriented and cooperative. Patient is in no acute distress.  Skin: Skin is warm and dry. No rash noted.   Cardiovascular: Normal heart rate noted  Respiratory: Normal respiratory effort, no problems with respiration noted  Abdomen: Soft, gravid, appropriate for gestational age.  Pain/Pressure: Present     Pelvic: Cervical exam deferred        Extremities: Normal range of motion.  Edema: None  Mental Status: Normal mood and affect. Normal behavior. Normal judgment and thought content.   Assessment and Plan:  Pregnancy: G2P1001 at [redacted]w[redacted]d 1. Supervision of other normal pregnancy, antepartum Routine test - Glucose Tolerance, 2 Hours w/1 Hour - RPR - CBC - HIV antibody (with reflex) - Tdap vaccine greater than or equal to 7yo IM  2. [redacted] weeks gestation of pregnancy  - Glucose Tolerance, 2 Hours w/1 Hour - RPR - CBC - HIV antibody (with reflex) - Tdap vaccine greater than or equal to 7yo IM  Preterm labor symptoms and general obstetric precautions including but not limited to vaginal bleeding, contractions, leaking of fluid and fetal movement were reviewed in detail with the patient. Please  refer to After Visit Summary for other counseling recommendations.   Return in about 3 weeks (around 08/09/2021).  No future appointments.  Scheryl Darter, MD

## 2021-07-20 LAB — HIV ANTIBODY (ROUTINE TESTING W REFLEX): HIV Screen 4th Generation wRfx: NONREACTIVE

## 2021-07-20 LAB — CBC
Hematocrit: 33.5 % — ABNORMAL LOW (ref 34.0–46.6)
Hemoglobin: 11.1 g/dL (ref 11.1–15.9)
MCH: 30 pg (ref 26.6–33.0)
MCHC: 33.1 g/dL (ref 31.5–35.7)
MCV: 91 fL (ref 79–97)
Platelets: 286 10*3/uL (ref 150–450)
RBC: 3.7 x10E6/uL — ABNORMAL LOW (ref 3.77–5.28)
RDW: 12.8 % (ref 11.7–15.4)
WBC: 10.9 10*3/uL — ABNORMAL HIGH (ref 3.4–10.8)

## 2021-07-20 LAB — GLUCOSE TOLERANCE, 2 HOURS W/ 1HR
Glucose, 1 hour: 140 mg/dL (ref 70–179)
Glucose, 2 hour: 103 mg/dL (ref 70–152)
Glucose, Fasting: 85 mg/dL (ref 70–91)

## 2021-07-20 LAB — RPR: RPR Ser Ql: NONREACTIVE

## 2021-07-21 ENCOUNTER — Observation Stay (HOSPITAL_COMMUNITY)
Admission: EM | Admit: 2021-07-21 | Discharge: 2021-07-22 | Disposition: A | Discharge status: Home / Self Care | Payer: No Typology Code available for payment source | Attending: Obstetrics and Gynecology | Admitting: Obstetrics and Gynecology

## 2021-07-21 ENCOUNTER — Other Ambulatory Visit: Payer: Self-pay

## 2021-07-21 ENCOUNTER — Encounter (HOSPITAL_COMMUNITY): Payer: Self-pay | Admitting: Emergency Medicine

## 2021-07-21 DIAGNOSIS — R109 Unspecified abdominal pain: Secondary | ICD-10-CM | POA: Diagnosis present

## 2021-07-21 DIAGNOSIS — O26892 Other specified pregnancy related conditions, second trimester: Principal | ICD-10-CM | POA: Diagnosis present

## 2021-07-21 DIAGNOSIS — O99419 Diseases of the circulatory system complicating pregnancy, unspecified trimester: Secondary | ICD-10-CM | POA: Diagnosis not present

## 2021-07-21 DIAGNOSIS — Y929 Unspecified place or not applicable: Secondary | ICD-10-CM | POA: Insufficient documentation

## 2021-07-21 DIAGNOSIS — Y999 Unspecified external cause status: Secondary | ICD-10-CM | POA: Diagnosis not present

## 2021-07-21 DIAGNOSIS — O26899 Other specified pregnancy related conditions, unspecified trimester: Secondary | ICD-10-CM | POA: Insufficient documentation

## 2021-07-21 DIAGNOSIS — Y939 Activity, unspecified: Secondary | ICD-10-CM | POA: Diagnosis not present

## 2021-07-21 DIAGNOSIS — Y92410 Unspecified street and highway as the place of occurrence of the external cause: Secondary | ICD-10-CM

## 2021-07-21 DIAGNOSIS — Z3A27 27 weeks gestation of pregnancy: Secondary | ICD-10-CM | POA: Insufficient documentation

## 2021-07-21 DIAGNOSIS — R079 Chest pain, unspecified: Secondary | ICD-10-CM | POA: Insufficient documentation

## 2021-07-21 DIAGNOSIS — O26893 Other specified pregnancy related conditions, third trimester: Secondary | ICD-10-CM

## 2021-07-21 LAB — COMPREHENSIVE METABOLIC PANEL
ALT: 8 U/L (ref 0–44)
AST: 16 U/L (ref 15–41)
Albumin: 2.9 g/dL — ABNORMAL LOW (ref 3.5–5.0)
Alkaline Phosphatase: 46 U/L (ref 38–126)
Anion gap: 9 (ref 5–15)
BUN: 5 mg/dL — ABNORMAL LOW (ref 6–20)
CO2: 20 mmol/L — ABNORMAL LOW (ref 22–32)
Calcium: 9.4 mg/dL (ref 8.9–10.3)
Chloride: 110 mmol/L (ref 98–111)
Creatinine, Ser: 0.48 mg/dL (ref 0.44–1.00)
GFR, Estimated: >60 mL/min (ref >60)
Glucose, Bld: 108 mg/dL — ABNORMAL HIGH (ref 70–99)
Potassium: 3.2 mmol/L — ABNORMAL LOW (ref 3.5–5.1)
Sodium: 139 mmol/L (ref 135–145)
Total Bilirubin: 0.3 mg/dL (ref 0.3–1.2)
Total Protein: 6 g/dL — ABNORMAL LOW (ref 6.5–8.1)

## 2021-07-21 LAB — CBC WITH DIFFERENTIAL/PLATELET
Abs Immature Granulocytes: 0.04 10*3/uL (ref 0.00–0.07)
Basophils Absolute: 0 10*3/uL (ref 0.0–0.1)
Basophils Relative: 0 %
Eosinophils Absolute: 0.2 10*3/uL (ref 0.0–0.5)
Eosinophils Relative: 2 %
HCT: 33.1 % — ABNORMAL LOW (ref 36.0–46.0)
Hemoglobin: 11.2 g/dL — ABNORMAL LOW (ref 12.0–15.0)
Immature Granulocytes: 0 %
Lymphocytes Relative: 17 %
Lymphs Abs: 2.1 10*3/uL (ref 0.7–4.0)
MCH: 30.4 pg (ref 26.0–34.0)
MCHC: 33.8 g/dL (ref 30.0–36.0)
MCV: 89.9 fL (ref 80.0–100.0)
Monocytes Absolute: 1.2 10*3/uL — ABNORMAL HIGH (ref 0.1–1.0)
Monocytes Relative: 10 %
Neutro Abs: 8.5 10*3/uL — ABNORMAL HIGH (ref 1.7–7.7)
Neutrophils Relative %: 71 %
Platelets: 304 10*3/uL (ref 150–400)
RBC: 3.68 MIL/uL — ABNORMAL LOW (ref 3.87–5.11)
RDW: 13.5 % (ref 11.5–15.5)
WBC: 12.1 10*3/uL — ABNORMAL HIGH (ref 4.0–10.5)
nRBC: 0 % (ref 0.0–0.2)

## 2021-07-21 LAB — TYPE AND SCREEN
ABO/RH(D): B POS
Antibody Screen: NEGATIVE

## 2021-07-21 MED ORDER — CALCIUM CARBONATE ANTACID 500 MG PO CHEW: 2.0000 | CHEWABLE_TABLET | ORAL | Status: DC | PRN

## 2021-07-21 MED ORDER — ZOLPIDEM TARTRATE 5 MG PO TABS: 5.0000 mg | ORAL_TABLET | Freq: Every evening | ORAL | Status: DC | PRN

## 2021-07-21 MED ORDER — SODIUM CHLORIDE 0.9 % IV SOLN: INTRAVENOUS | Status: DC

## 2021-07-21 MED ORDER — PRENATAL MULTIVITAMIN CH
1.0000 | ORAL_TABLET | Freq: Every day | ORAL | Status: DC
Start: 2021-07-22 — End: 2021-07-22

## 2021-07-21 MED ORDER — ACETAMINOPHEN 325 MG PO TABS: 650.0000 mg | ORAL_TABLET | ORAL | Status: DC | PRN
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
## 2021-07-21 NOTE — ED Notes (Signed)
Rapid OB contacted, Marissa enroute
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
## 2021-07-22 DIAGNOSIS — Z3A27 27 weeks gestation of pregnancy: Secondary | ICD-10-CM | POA: Diagnosis not present

## 2021-07-22 DIAGNOSIS — O26893 Other specified pregnancy related conditions, third trimester: Secondary | ICD-10-CM

## 2021-07-22 DIAGNOSIS — O99419 Diseases of the circulatory system complicating pregnancy, unspecified trimester: Secondary | ICD-10-CM | POA: Diagnosis not present

## 2021-07-22 LAB — KLEIHAUER-BETKE STAIN
Fetal Cells %: 0 %
Quantitation Fetal Hemoglobin: 0 mL
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
## 2021-08-10 ENCOUNTER — Ambulatory Visit (INDEPENDENT_AMBULATORY_CARE_PROVIDER_SITE_OTHER): Payer: Medicaid Other | Admitting: Medical

## 2021-08-10 VITALS — BP 97/64 | HR 112 | Wt 209.0 lb

## 2021-08-10 DIAGNOSIS — Z3A30 30 weeks gestation of pregnancy: Secondary | ICD-10-CM

## 2021-08-10 DIAGNOSIS — O99891 Other specified diseases and conditions complicating pregnancy: Secondary | ICD-10-CM

## 2021-08-10 DIAGNOSIS — Z3483 Encounter for supervision of other normal pregnancy, third trimester: Secondary | ICD-10-CM

## 2021-08-10 DIAGNOSIS — M549 Dorsalgia, unspecified: Secondary | ICD-10-CM
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
## 2021-08-23 ENCOUNTER — Encounter: Payer: Self-pay | Admitting: Student

## 2021-08-23 ENCOUNTER — Ambulatory Visit (INDEPENDENT_AMBULATORY_CARE_PROVIDER_SITE_OTHER): Payer: Medicaid Other | Admitting: Student

## 2021-08-23 VITALS — BP 137/87 | HR 105 | Wt 209.9 lb

## 2021-08-23 DIAGNOSIS — O99891 Other specified diseases and conditions complicating pregnancy: Secondary | ICD-10-CM

## 2021-08-23 DIAGNOSIS — R42 Dizziness and giddiness: Secondary | ICD-10-CM

## 2021-08-23 DIAGNOSIS — Z3A32 32 weeks gestation of pregnancy: Secondary | ICD-10-CM

## 2021-08-23 DIAGNOSIS — M549 Dorsalgia, unspecified: Secondary | ICD-10-CM

## 2021-08-23 DIAGNOSIS — Z3483 Encounter for supervision of other normal pregnancy, third trimester: Secondary | ICD-10-CM

## 2021-08-23 NOTE — Progress Notes (Signed)
Pt presents for ROB visit. Pt c/o of increased dizziness over the last 3 weeks. No other concerns at this time.

## 2021-08-24 LAB — CBC
Hematocrit: 33.5 % — ABNORMAL LOW (ref 34.0–46.6)
Hemoglobin: 11 g/dL — ABNORMAL LOW (ref 11.1–15.9)
MCH: 29.6 pg (ref 26.6–33.0)
MCHC: 32.8 g/dL (ref 31.5–35.7)
MCV: 90 fL (ref 79–97)
Platelets: 326 10*3/uL (ref 150–450)
RBC: 3.71 x10E6/uL — ABNORMAL LOW (ref 3.77–5.28)
RDW: 13.1 % (ref 11.7–15.4)
WBC: 11.9 10*3/uL — ABNORMAL HIGH (ref 3.4–10.8)

## 2021-08-24 LAB — COMPREHENSIVE METABOLIC PANEL
ALT: 5 IU/L (ref 0–32)
AST: 11 IU/L (ref 0–40)
Albumin/Globulin Ratio: 1.3 (ref 1.2–2.2)
Albumin: 3.5 g/dL — ABNORMAL LOW (ref 4.0–5.0)
Alkaline Phosphatase: 59 IU/L (ref 44–121)
BUN/Creatinine Ratio: 13 (ref 9–23)
BUN: 6 mg/dL (ref 6–20)
Bilirubin Total: <0.2 mg/dL (ref 0.0–1.2)
CO2: 18 mmol/L — ABNORMAL LOW (ref 20–29)
Calcium: 9.7 mg/dL (ref 8.7–10.2)
Chloride: 107 mmol/L — ABNORMAL HIGH (ref 96–106)
Creatinine, Ser: 0.45 mg/dL — ABNORMAL LOW (ref 0.57–1.00)
Globulin, Total: 2.7 g/dL (ref 1.5–4.5)
Glucose: 89 mg/dL (ref 70–99)
Potassium: 3.9 mmol/L (ref 3.5–5.2)
Sodium: 142 mmol/L (ref 134–144)
Total Protein: 6.2 g/dL (ref 6.0–8.5)
eGFR: 133 mL/min/{1.73_m2} (ref >59)

## 2021-08-25 NOTE — Addendum Note (Signed)
Addended by: Corlis Hove on: 08/25/2021 09:53 AM   Modules accepted: Orders
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
## 2021-09-06 ENCOUNTER — Ambulatory Visit (INDEPENDENT_AMBULATORY_CARE_PROVIDER_SITE_OTHER): Payer: Medicaid Other | Admitting: Obstetrics & Gynecology

## 2021-09-06 VITALS — BP 103/69 | HR 94 | Wt 209.0 lb

## 2021-09-06 DIAGNOSIS — Z3A34 34 weeks gestation of pregnancy: Secondary | ICD-10-CM

## 2021-09-06 DIAGNOSIS — Z3483 Encounter for supervision of other normal pregnancy, third trimester: Secondary | ICD-10-CM
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
## 2021-09-06 NOTE — Patient Instructions (Signed)
Return to office for any scheduled appointments. Call the office or go to the MAU at [facility] if: You begin to have strong, frequent contractions Your water breaks.  Sometimes it is a big gush of fluid, sometimes it is just a trickle that keeps getting your underwear wet or running down your legs You have vaginal bleeding.  It is normal to have a small amount of spotting if your cervix was checked.  You do not feel your baby moving like normal.  If you do not, get something to eat and drink and lay down and focus on feeling your baby move.   If your baby is still not moving like normal, you should call the office or go to MAU. Any other obstetric concerns.  

## 2021-09-13 ENCOUNTER — Ambulatory Visit: Payer: Medicaid Other | Attending: Student | Admitting: Physical Therapy

## 2021-09-13 ENCOUNTER — Encounter: Payer: Self-pay | Admitting: Physical Therapy

## 2021-09-13 ENCOUNTER — Other Ambulatory Visit: Payer: Self-pay

## 2021-09-13 DIAGNOSIS — R293 Abnormal posture: Secondary | ICD-10-CM | POA: Insufficient documentation

## 2021-09-13 DIAGNOSIS — M5459 Other low back pain: Secondary | ICD-10-CM | POA: Insufficient documentation

## 2021-09-13 DIAGNOSIS — O99891 Other specified diseases and conditions complicating pregnancy: Secondary | ICD-10-CM | POA: Insufficient documentation

## 2021-09-13 DIAGNOSIS — M549 Dorsalgia, unspecified: Secondary | ICD-10-CM | POA: Diagnosis not present
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
## 2021-09-20 ENCOUNTER — Other Ambulatory Visit (HOSPITAL_COMMUNITY)
Admission: RE | Admit: 2021-09-20 | Discharge: 2021-09-20 | Disposition: A | Discharge status: Home / Self Care | Payer: Medicaid Other | Source: Ambulatory Visit | Attending: Obstetrics and Gynecology | Admitting: Obstetrics and Gynecology

## 2021-09-20 ENCOUNTER — Encounter: Payer: Self-pay | Admitting: Obstetrics and Gynecology

## 2021-09-20 ENCOUNTER — Ambulatory Visit (INDEPENDENT_AMBULATORY_CARE_PROVIDER_SITE_OTHER): Payer: Medicaid Other | Admitting: Obstetrics and Gynecology

## 2021-09-20 VITALS — BP 120/86 | HR 110 | Wt 211.6 lb

## 2021-09-20 DIAGNOSIS — Z3483 Encounter for supervision of other normal pregnancy, third trimester: Secondary | ICD-10-CM | POA: Diagnosis present

## 2021-09-20 DIAGNOSIS — Z3490 Encounter for supervision of normal pregnancy, unspecified, unspecified trimester: Secondary | ICD-10-CM

## 2021-09-20 DIAGNOSIS — Z3A36 36 weeks gestation of pregnancy: Secondary | ICD-10-CM
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
## 2021-09-20 NOTE — Progress Notes (Signed)
Pt presents for ROB visit. No concerns at this time.  

## 2021-09-21 LAB — CERVICOVAGINAL ANCILLARY ONLY
Chlamydia: NEGATIVE
Comment: NEGATIVE
Comment: NEGATIVE
Comment: NORMAL
Neisseria Gonorrhea: NEGATIVE
Trichomonas: NEGATIVE

## 2021-09-22 ENCOUNTER — Ambulatory Visit: Payer: Medicaid Other | Admitting: Physical Therapy

## 2021-09-22 LAB — STREP GP B NAA: Strep Gp B NAA: NEGATIVE

## 2021-09-25 ENCOUNTER — Encounter: Payer: Self-pay | Admitting: *Deleted

## 2021-09-25 ENCOUNTER — Ambulatory Visit: Payer: Medicaid Other | Admitting: *Deleted

## 2021-09-25 ENCOUNTER — Ambulatory Visit: Payer: Medicaid Other | Attending: Obstetrics and Gynecology

## 2021-09-25 DIAGNOSIS — O26843 Uterine size-date discrepancy, third trimester: Secondary | ICD-10-CM | POA: Insufficient documentation

## 2021-09-25 DIAGNOSIS — Z3490 Encounter for supervision of normal pregnancy, unspecified, unspecified trimester: Secondary | ICD-10-CM | POA: Diagnosis present

## 2021-09-25 DIAGNOSIS — Z3A36 36 weeks gestation of pregnancy: Secondary | ICD-10-CM | POA: Insufficient documentation

## 2021-09-25 DIAGNOSIS — O99213 Obesity complicating pregnancy, third trimester: Secondary | ICD-10-CM | POA: Insufficient documentation

## 2021-09-26 ENCOUNTER — Other Ambulatory Visit: Payer: Self-pay | Admitting: *Deleted

## 2021-09-26 NOTE — Progress Notes (Signed)
Healthcare Information Questionnaire/ leave paperwork completed for 6-8 weeks postpartum recovery and bonding. Emailed and copied to patient as directed by patient.

## 2021-09-27 ENCOUNTER — Ambulatory Visit (INDEPENDENT_AMBULATORY_CARE_PROVIDER_SITE_OTHER): Payer: Medicaid Other | Admitting: Obstetrics and Gynecology

## 2021-09-27 ENCOUNTER — Encounter: Payer: Self-pay | Admitting: Obstetrics and Gynecology

## 2021-09-27 VITALS — BP 118/72 | HR 93 | Wt 211.0 lb

## 2021-09-27 DIAGNOSIS — Z3A37 37 weeks gestation of pregnancy: Secondary | ICD-10-CM

## 2021-09-27 DIAGNOSIS — Z3483 Encounter for supervision of other normal pregnancy, third trimester: Secondary | ICD-10-CM
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
## 2021-09-27 NOTE — Patient Instructions (Signed)
Vaginal Delivery  Vaginal delivery means that you give birth by pushing your baby out of your birth canal (vagina). Your health care team will help you before, during, and after vaginal delivery. Birth experiences are unique for every woman and every pregnancy, and birth experiences vary depending on where you choose to give birth. What are the risks and benefits? Generally, this is safe. However, problems may occur, including: Bleeding. Infection. Damage to other structures such as vaginal tearing. Allergic reactions to medicines. Despite the risks, benefits of vaginal delivery include less risk of bleeding and infection and a shorter recovery time compared to a Cesarean delivery. Cesarean delivery, or C-section, is the surgical delivery of a baby. What happens when I arrive at the birth center or hospital? Once you are in labor and have been admitted into the hospital or birth center, your health care team may: Review your pregnancy history and any concerns that you have. Talk with you about your birth plan and discuss pain control options. Check your blood pressure, breathing, and heartbeat. Assess your baby's heartbeat. Monitor your uterus for contractions. Check whether your bag of water (amniotic sac) has broken (ruptured). Insert an IV into one of your veins. This may be used to give you fluids and medicines. Monitoring Your health care team may assess your contractions (uterine monitoring) and your baby's heart rate (fetal monitoring). You may need to be monitored: Often, but not continuously (intermittently). All the time or for long periods at a time (continuously). Continuous monitoring may be needed if: You are taking certain medicines, such as medicine to relieve pain or make your contractions stronger. You have pregnancy or labor complications. Monitoring may be done by: Placing a special stethoscope or a handheld monitoring device on your abdomen to check your baby's  heartbeat and to check for contractions. Placing monitors on your abdomen (external monitors) to record your baby's heartbeat and the frequency and length of contractions. Placing monitors inside your uterus through your vagina (internal monitors) to record your baby's heartbeat and the frequency, length, and strength of your contractions. Depending on the type of monitor, it may remain in your uterus or on your baby's head until birth. Telemetry. This is a type of continuous monitoring that can be done with external or internal monitors. Instead of having to stay in bed, you are able to move around. Physical exam Your health care team may perform frequent physical exams. This may include: Checking how and where your baby is positioned in your uterus. Checking your cervix to determine: Whether it is thinning out (effacing). Whether it is opening up (dilating). What happens during labor and delivery?  Normal labor and delivery is divided into the following three stages: Stage 1 This is the longest stage of labor. Throughout this stage, you will feel contractions. Contractions generally feel mild, infrequent, and irregular at first. They get stronger, more frequent, and more regular as you move through this stage. You may have contractions about every 2-3 minutes. This stage ends when your cervix is completely dilated to 4 inches (10 cm) and completely effaced. Stage 2 This stage starts once your cervix is completely effaced and dilated and lasts until the delivery of your baby. This is the stage where you will feel an urge to push your baby out of your vagina. You may feel stretching and burning pain, especially when the widest part of your baby's head passes through the vaginal opening (crowning). Once your baby is delivered, the umbilical cord will be   clamped and cut. Timing of cutting the cord will depend on your wishes, your baby's health, and your health care provider's practices. Your baby  will be placed on your bare chest (skin-to-skin contact) in an upright position and covered with a warm blanket. If you are choosing to breastfeed, watch your baby for feeding cues, like rooting or sucking, and help the baby to your breast for his or her first feeding. Stage 3 This stage starts immediately after the birth of your baby and ends after you deliver the placenta. This stage may take anywhere from 5 to 30 minutes. After your baby has been delivered, you will feel contractions as your body expels the placenta. These contractions also help your uterus get smaller and reduce bleeding. What can I expect after labor and delivery? After labor is over, you and your baby will be assessed closely until you are ready to go home. Your health care team will teach you how to care for yourself and your baby. You and your baby may be encouraged to stay in the same room (rooming in) during your hospital stay. This will help promote early bonding and successful breastfeeding. Your uterus will be checked and massaged regularly (fundal massage). You may continue to receive fluids and medicines through an IV. You will have some soreness and pain in your abdomen, vagina, and the area of skin between your vaginal opening and your anus (perineum). If an incision was made near your vagina (episiotomy) or if you had some vaginal tearing during delivery, cold compresses may be placed on your episiotomy or your tear. This helps to reduce pain and swelling. It is normal to have vaginal bleeding after delivery. Wear a sanitary pad for vaginal bleeding and discharge. Summary Vaginal delivery means that you will give birth by pushing your baby out of your birth canal (vagina). Your health care team will monitor you and your baby throughout the stages of labor. After you deliver your baby, your health care team will continue to assess you and your baby to ensure you are both recovering as expected after delivery. This  information is not intended to replace advice given to you by your health care provider. Make sure you discuss any questions you have with your health care provider. Document Revised: 11/16/2019 Document Reviewed: 11/16/2019 Elsevier Patient Education  2023 Elsevier Inc.  

## 2021-09-29 ENCOUNTER — Telehealth: Payer: Self-pay | Admitting: Physical Therapy

## 2021-09-29 ENCOUNTER — Encounter (HOSPITAL_COMMUNITY): Payer: Self-pay

## 2021-09-29 ENCOUNTER — Ambulatory Visit: Payer: Medicaid Other | Admitting: Physical Therapy

## 2021-09-29 ENCOUNTER — Telehealth (HOSPITAL_COMMUNITY): Payer: Self-pay | Admitting: *Deleted

## 2021-09-29 NOTE — Telephone Encounter (Signed)
Called pt regarding no show for 10:15 appt this AM - did not answer, left voice mail, notified of attendance policy and next appt.

## 2021-09-29 NOTE — Telephone Encounter (Signed)
Preadmission screen  

## 2021-10-01 ENCOUNTER — Other Ambulatory Visit: Payer: Self-pay | Admitting: Advanced Practice Midwife

## 2021-10-04 ENCOUNTER — Encounter: Payer: Self-pay | Admitting: Obstetrics and Gynecology

## 2021-10-04 ENCOUNTER — Ambulatory Visit (INDEPENDENT_AMBULATORY_CARE_PROVIDER_SITE_OTHER): Payer: Medicaid Other | Admitting: Obstetrics and Gynecology

## 2021-10-04 VITALS — BP 119/65 | HR 99 | Wt 210.0 lb

## 2021-10-04 DIAGNOSIS — O09299 Supervision of pregnancy with other poor reproductive or obstetric history, unspecified trimester: Secondary | ICD-10-CM | POA: Insufficient documentation

## 2021-10-04 DIAGNOSIS — O09293 Supervision of pregnancy with other poor reproductive or obstetric history, third trimester: Secondary | ICD-10-CM | POA: Insufficient documentation

## 2021-10-04 DIAGNOSIS — O09893 Supervision of other high risk pregnancies, third trimester: Secondary | ICD-10-CM

## 2021-10-04 DIAGNOSIS — O0993 Supervision of high risk pregnancy, unspecified, third trimester: Secondary | ICD-10-CM

## 2021-10-04 DIAGNOSIS — O99213 Obesity complicating pregnancy, third trimester: Secondary | ICD-10-CM | POA: Insufficient documentation

## 2021-10-04 DIAGNOSIS — Z6836 Body mass index (BMI) 36.0-36.9, adult: Secondary | ICD-10-CM

## 2021-10-04 DIAGNOSIS — Z3A38 38 weeks gestation of pregnancy: Secondary | ICD-10-CM

## 2021-10-05 ENCOUNTER — Encounter (HOSPITAL_COMMUNITY): Payer: Self-pay | Admitting: *Deleted

## 2021-10-05 ENCOUNTER — Telehealth (HOSPITAL_COMMUNITY): Payer: Self-pay | Admitting: *Deleted

## 2021-10-05 NOTE — Progress Notes (Signed)
   PRENATAL VISIT NOTE  Subjective:  Madeline Cooper is a 31 y.o. G2P1001 at [redacted]w[redacted]d being seen today for ongoing prenatal care.  She is currently monitored for the following issues for this high-risk pregnancy and has Supervision of high risk pregnancy, antepartum, third trimester; History of shoulder dystocia in prior pregnancy, currently pregnant in third trimester; Short interval between pregnancies affecting pregnancy in third trimester, antepartum; BMI 36.0-36.9,adult; and Obesity in pregnancy, antepartum, third trimester on their problem list.  Patient reports no complaints.  Contractions: Irregular. Vag. Bleeding: None.  Movement: Present. Denies leaking of fluid.   The following portions of the patient's history were reviewed and updated as appropriate: allergies, current medications, past family history, past medical history, past social history, past surgical history and problem list.   Objective:   Vitals:   10/04/21 1638  BP: 119/65  Pulse: 99  Weight: 210 lb (95.3 kg)    Fetal Status: Fetal Heart Rate (bpm): 138   Movement: Present     General:  Alert, oriented and cooperative. Patient is in no acute distress.  Skin: Skin is warm and dry. No rash noted.   Cardiovascular: Normal heart rate noted  Respiratory: Normal respiratory effort, no problems with respiration noted  Abdomen: Soft, gravid, appropriate for gestational age.  Pain/Pressure: Present     Pelvic: Cervical exam deferred        Extremities: Normal range of motion.     Mental Status: Normal mood and affect. Normal behavior. Normal judgment and thought content.   Assessment and Plan:  Pregnancy: G2P1001 at [redacted]w[redacted]d 1. Supervision of high risk pregnancy, antepartum, third trimester GBS neg. PP IUD  2. History of shoulder dystocia in prior pregnancy, currently pregnant in third trimester 09/2020: SVD at 39/2 wks. 3756gm, 30 second shoulder dystocia that was relieved with McRoberts and suprapubic pressure. No GDM in  that pregnancy. Child is doing well and no deficits. Growth u/s done just prior to delivery was 95%, 3934gm, ac>99%. Most recent growth u/s this pregnancy was on 9/25 and was 76%, 3204gm, ac>99%. Patient unsure if this baby feels bigger or smaller. Plan for IOL at 39wks to ameliorate risks associated with shoulder dystocia  3. Short interval between pregnancies affecting pregnancy in third trimester, antepartum  4. Obesity in pregnancy, antepartum, third trimester  5. BMI 36.0-36.9,adult  Term labor symptoms and general obstetric precautions including but not limited to vaginal bleeding, contractions, leaking of fluid and fetal movement were reviewed in detail with the patient. Please refer to After Visit Summary for other counseling recommendations.   No follow-ups on file.  Future Appointments  Date Time Provider [place]  10/06/2021 10:15 AM Marjo Bicker [facility]  10/11/2021  6:45 AM [facility] SCHED ROOM [facility] None  10/13/2021 10:15 AM Leeroy Cha, PT [facility]    Aletha Halim, MD

## 2021-10-05 NOTE — Telephone Encounter (Signed)
Preadmission screen  

## 2021-10-06 ENCOUNTER — Ambulatory Visit: Payer: Medicaid Other | Attending: Student | Admitting: Physical Therapy

## 2021-10-06 ENCOUNTER — Telehealth: Payer: Self-pay | Admitting: Physical Therapy

## 2021-10-06 NOTE — Telephone Encounter (Signed)
Called pt regarding no show for today's appointment - no answer, unable to leave voicemail as mail box full

## 2021-10-11 ENCOUNTER — Encounter: Payer: Medicaid Other | Admitting: Advanced Practice Midwife

## 2021-10-11 ENCOUNTER — Inpatient Hospital Stay (HOSPITAL_COMMUNITY): Payer: Medicaid Other

## 2021-10-11 ENCOUNTER — Other Ambulatory Visit: Payer: Self-pay

## 2021-10-11 ENCOUNTER — Inpatient Hospital Stay (HOSPITAL_COMMUNITY): Payer: Medicaid Other | Admitting: Anesthesiology

## 2021-10-11 ENCOUNTER — Encounter (HOSPITAL_COMMUNITY): Payer: Self-pay | Admitting: Obstetrics and Gynecology

## 2021-10-11 ENCOUNTER — Inpatient Hospital Stay (HOSPITAL_COMMUNITY)
Admission: AD | Admit: 2021-10-11 | Discharge: 2021-10-12 | DRG: 807 | Disposition: A | Discharge status: Home / Self Care | Payer: Medicaid Other | Attending: Obstetrics and Gynecology | Admitting: Obstetrics and Gynecology

## 2021-10-11 DIAGNOSIS — O99214 Obesity complicating childbirth: Secondary | ICD-10-CM | POA: Diagnosis present

## 2021-10-11 DIAGNOSIS — Z3A39 39 weeks gestation of pregnancy: Secondary | ICD-10-CM

## 2021-10-11 DIAGNOSIS — Z3483 Encounter for supervision of other normal pregnancy, third trimester: Secondary | ICD-10-CM

## 2021-10-11 DIAGNOSIS — O26893 Other specified pregnancy related conditions, third trimester: Secondary | ICD-10-CM | POA: Diagnosis present

## 2021-10-11 DIAGNOSIS — Z30430 Encounter for insertion of intrauterine contraceptive device: Secondary | ICD-10-CM | POA: Diagnosis not present

## 2021-10-11 DIAGNOSIS — Z331 Pregnant state, incidental: Principal | ICD-10-CM

## 2021-10-11 LAB — CBC
HCT: 35.5 % — ABNORMAL LOW (ref 36.0–46.0)
Hemoglobin: 11.4 g/dL — ABNORMAL LOW (ref 12.0–15.0)
MCH: 29.2 pg (ref 26.0–34.0)
MCHC: 32.1 g/dL (ref 30.0–36.0)
MCV: 91 fL (ref 80.0–100.0)
Platelets: 326 10*3/uL (ref 150–400)
RBC: 3.9 MIL/uL (ref 3.87–5.11)
RDW: 14.8 % (ref 11.5–15.5)
WBC: 8.9 10*3/uL (ref 4.0–10.5)
nRBC: 0 % (ref 0.0–0.2)

## 2021-10-11 LAB — TYPE AND SCREEN
ABO/RH(D): B POS
Antibody Screen: NEGATIVE

## 2021-10-11 LAB — RPR: RPR Ser Ql: NONREACTIVE

## 2021-10-11 MED ORDER — TETANUS-DIPHTH-ACELL PERTUSSIS 5-2.5-18.5 LF-MCG/0.5 IM SUSY: 0.5000 mL | PREFILLED_SYRINGE | Freq: Once | INTRAMUSCULAR | Status: DC

## 2021-10-11 MED ORDER — MISOPROSTOL 50MCG HALF TABLET
50.0000 ug | ORAL_TABLET | Freq: Once | ORAL | Status: AC
  Administered 2021-10-11: 50 ug via BUCCAL
  Filled 2021-10-11: qty 1

## 2021-10-11 MED ORDER — PARAGARD INTRAUTERINE COPPER IU IUD
1.0000 | INTRAUTERINE_SYSTEM | Freq: Once | INTRAUTERINE | Status: AC
  Administered 2021-10-11: 1 via INTRAUTERINE
  Filled 2021-10-11: qty 1

## 2021-10-11 MED ORDER — SOD CITRATE-CITRIC ACID 500-334 MG/5ML PO SOLN: 30.0000 mL | ORAL | Status: DC | PRN

## 2021-10-11 MED ORDER — OXYTOCIN BOLUS FROM INFUSION
333.0000 mL | Freq: Once | INTRAVENOUS | Status: AC
  Administered 2021-10-11: 333 mL via INTRAVENOUS

## 2021-10-11 MED ORDER — DIPHENHYDRAMINE HCL 50 MG/ML IJ SOLN: 12.5000 mg | INTRAMUSCULAR | Status: DC | PRN

## 2021-10-11 MED ORDER — ONDANSETRON HCL 4 MG/2ML IJ SOLN: 4.0000 mg | INTRAMUSCULAR | Status: DC | PRN

## 2021-10-11 MED ORDER — LACTATED RINGERS IV SOLN: 500.0000 mL | INTRAVENOUS | Status: DC | PRN

## 2021-10-11 MED ORDER — ONDANSETRON HCL 4 MG PO TABS: 4.0000 mg | ORAL_TABLET | ORAL | Status: DC | PRN

## 2021-10-11 MED ORDER — FENTANYL-BUPIVACAINE-NACL 0.5-0.125-0.9 MG/250ML-% EP SOLN: 12.0000 mL/h | EPIDURAL | Status: DC | PRN

## 2021-10-11 MED ORDER — ONDANSETRON HCL 4 MG/2ML IJ SOLN
4.0000 mg | Freq: Four times a day (QID) | INTRAMUSCULAR | Status: DC | PRN
  Filled 2021-10-11: qty 2

## 2021-10-11 MED ORDER — LIDOCAINE HCL (PF) 1 % IJ SOLN: 30.0000 mL | INTRAMUSCULAR | Status: DC | PRN

## 2021-10-11 MED ORDER — LIDOCAINE HCL (PF) 1 % IJ SOLN
INTRAMUSCULAR | Status: DC | PRN
  Administered 2021-10-11: 8 mL via EPIDURAL

## 2021-10-11 MED ORDER — PRENATAL MULTIVITAMIN CH
1.0000 | ORAL_TABLET | Freq: Every day | ORAL | Status: DC
  Administered 2021-10-12: 1 via ORAL
  Filled 2021-10-11: qty 1

## 2021-10-11 MED ORDER — OXYTOCIN-SODIUM CHLORIDE 30-0.9 UT/500ML-% IV SOLN
1.0000 m[IU]/min | INTRAVENOUS | Status: DC
  Administered 2021-10-11: 2 m[IU]/min via INTRAVENOUS
  Filled 2021-10-11: qty 500

## 2021-10-11 MED ORDER — FENTANYL-BUPIVACAINE-NACL 0.5-0.125-0.9 MG/250ML-% EP SOLN
12.0000 mL/h | EPIDURAL | Status: DC | PRN
  Administered 2021-10-11: 12 mL/h via EPIDURAL
  Filled 2021-10-11: qty 250

## 2021-10-11 MED ORDER — EPHEDRINE 5 MG/ML INJ: 10.0000 mg | INTRAVENOUS | Status: DC | PRN

## 2021-10-11 MED ORDER — DIBUCAINE (PERIANAL) 1 % EX OINT: 1.0000 | TOPICAL_OINTMENT | CUTANEOUS | Status: DC | PRN

## 2021-10-11 MED ORDER — BENZOCAINE-MENTHOL 20-0.5 % EX AERO: 1.0000 | INHALATION_SPRAY | CUTANEOUS | Status: DC | PRN

## 2021-10-11 MED ORDER — OXYCODONE-ACETAMINOPHEN 5-325 MG PO TABS: 2.0000 | ORAL_TABLET | ORAL | Status: DC | PRN

## 2021-10-11 MED ORDER — COCONUT OIL OIL: 1.0000 | TOPICAL_OIL | Status: DC | PRN

## 2021-10-11 MED ORDER — ACETAMINOPHEN 325 MG PO TABS: 650.0000 mg | ORAL_TABLET | ORAL | Status: DC | PRN

## 2021-10-11 MED ORDER — PHENYLEPHRINE 80 MCG/ML (10ML) SYRINGE FOR IV PUSH (FOR BLOOD PRESSURE SUPPORT): 80.0000 ug | PREFILLED_SYRINGE | INTRAVENOUS | Status: DC | PRN

## 2021-10-11 MED ORDER — OXYCODONE-ACETAMINOPHEN 5-325 MG PO TABS: 1.0000 | ORAL_TABLET | ORAL | Status: DC | PRN

## 2021-10-11 MED ORDER — MAGNESIUM HYDROXIDE 400 MG/5ML PO SUSP: 30.0000 mL | ORAL | Status: DC | PRN

## 2021-10-11 MED ORDER — FENTANYL CITRATE (PF) 100 MCG/2ML IJ SOLN: 50.0000 ug | INTRAMUSCULAR | Status: DC | PRN

## 2021-10-11 MED ORDER — IBUPROFEN 600 MG PO TABS
600.0000 mg | ORAL_TABLET | Freq: Four times a day (QID) | ORAL | Status: DC
  Administered 2021-10-11 – 2021-10-12 (×4): 600 mg via ORAL
  Filled 2021-10-11 (×4): qty 1

## 2021-10-11 MED ORDER — MISOPROSTOL 50MCG HALF TABLET
50.0000 ug | ORAL_TABLET | Freq: Once | ORAL | Status: AC
  Administered 2021-10-11: 50 ug via ORAL
  Filled 2021-10-11: qty 1

## 2021-10-11 MED ORDER — DIPHENHYDRAMINE HCL 25 MG PO CAPS: 25.0000 mg | ORAL_CAPSULE | Freq: Four times a day (QID) | ORAL | Status: DC | PRN

## 2021-10-11 MED ORDER — OXYTOCIN-SODIUM CHLORIDE 30-0.9 UT/500ML-% IV SOLN: 2.5000 [IU]/h | INTRAVENOUS | Status: DC

## 2021-10-11 MED ORDER — LACTATED RINGERS IV SOLN: INTRAVENOUS | Status: DC

## 2021-10-11 MED ORDER — SIMETHICONE 80 MG PO CHEW: 80.0000 mg | CHEWABLE_TABLET | ORAL | Status: DC | PRN

## 2021-10-11 MED ORDER — LACTATED RINGERS IV SOLN: 500.0000 mL | Freq: Once | INTRAVENOUS | Status: DC

## 2021-10-11 MED ORDER — WITCH HAZEL-GLYCERIN EX PADS: 1.0000 | MEDICATED_PAD | CUTANEOUS | Status: DC | PRN

## 2021-10-11 NOTE — Anesthesia Preprocedure Evaluation (Signed)
Anesthesia Evaluation  Patient identified by MRN, date of birth, ID band Patient awake    Reviewed: Allergy & Precautions, H&P , NPO status , Patient's Chart, lab work & pertinent test results, reviewed documented beta blocker date and time   Airway Mallampati: I  TM Distance: >3 FB Neck ROM: full    Dental no notable dental hx. (+) Teeth Intact, Dental Advisory Given   Pulmonary neg pulmonary ROS,    Pulmonary exam normal breath sounds clear to auscultation       Cardiovascular negative cardio ROS Normal cardiovascular exam Rhythm:regular Rate:Normal     Neuro/Psych negative neurological ROS  negative psych ROS   GI/Hepatic negative GI ROS, Neg liver ROS,   Endo/Other  negative endocrine ROS  Renal/GU negative Renal ROS  negative genitourinary   Musculoskeletal   Abdominal   Peds  Hematology negative hematology ROS (+)   Anesthesia Other Findings   Reproductive/Obstetrics (+) Pregnancy                             Anesthesia Physical Anesthesia Plan  ASA: 2  Anesthesia Plan: Epidural   Post-op Pain Management: Minimal or no pain anticipated   Induction:   PONV Risk Score and Plan:   Airway Management Planned:   Additional Equipment: None  Intra-op Plan:   Post-operative Plan:   Informed Consent: I have reviewed the patients History and Physical, chart, labs and discussed the procedure including the risks, benefits and alternatives for the proposed anesthesia with the patient or authorized representative who has indicated his/her understanding and acceptance.     Dental Advisory Given  Plan Discussed with: Anesthesiologist  Anesthesia Plan Comments: (Labs checked- platelets confirmed with RN in room. Fetal heart tracing, per RN, reported to be stable enough for sitting procedure. Discussed epidural, and patient consents to the procedure:  included risk of possible  headache,backache, failed block, allergic reaction, and nerve injury. This patient was asked if she had any questions or concerns before the procedure started.)        Anesthesia Quick Evaluation

## 2021-10-11 NOTE — Lactation Note (Signed)
This note was copied from a baby's chart. Lactation Consultation Note  Patient Name: Girl Kit Brubacher [id number] Date: 10/11/2021 Reason for consult: L&D Initial assessment Age:30 hours P2, term female infant. Birth Parent latched infant on her right breast using the cross cradle hold position, infant sustained latch, latched with depth and was still breastfeeding after 11 minutes when LC left the room. Birth Parent will continue to breastfeed infant according to hunger cues, on demand, 8 to 12+ times within 24 hours, STS. Birth Parent knows to ask RN/LC for further latch assistance on MBU if needed.  Maternal Data Does the patient have breastfeeding experience prior to this delivery?: Yes How long did the patient breastfeed?: Per Birth Parent,  she only BF 1st child for 2 months, due using NS and infant had tongue tie, infant is currently 22 months old.  Feeding Mother's Current Feeding Choice: Breast Milk and Formula  LATCH Score Latch: Grasps breast easily, tongue down, lips flanged, rhythmical sucking.  Audible Swallowing: Spontaneous and intermittent  Type of Nipple: Everted at rest and after stimulation  Comfort (Breast/Nipple): Soft / non-tender  Hold (Positioning): Assistance needed to correctly position infant at breast and maintain latch.  LATCH Score: 9   Lactation Tools Discussed/Used    Interventions Interventions: Assisted with latch;Skin to skin;Breast compression;Adjust position;Support pillows;Position options;Education  Discharge    Consult Status Consult Status: Follow-up from L&D    Vicente Serene 10/11/2021, 7:43 PM

## 2021-10-11 NOTE — H&P (Addendum)
OBSTETRIC ADMISSION HISTORY AND PHYSICAL  Madeline Cooper is a 31 y.o. female G2P1001 with IUP at [redacted]w[redacted]d presenting for IOL. She reports +FMs. No LOF, VB, blurry vision, headaches, peripheral edema, or RUQ pain. She plans on breastfeeding. She requests paraguard post-placentally for birth control.  Dating: By LMP --->  Estimated Date of Delivery: 10/18/21  Sono: 09/25/21   @[redacted]w[redacted]d , normal anatomy, cephalic presentation, 3204g, , EFW 7lb 1oz   Prenatal History/Complications: H/o brief should dystocia <30s with previous pregnancy  Past Medical History: Past Medical History:  Diagnosis Date   Medical history non-contributory     Past Surgical History: Past Surgical History:  Procedure Laterality Date   COSMETIC SURGERY  08/2018   Eyelids   TONSILECTOMY, ADENOIDECTOMY, BILATERAL MYRINGOTOMY AND TUBES  1999   TONSILLECTOMY     WISDOM TOOTH EXTRACTION  2009    Obstetrical History: OB History     Gravida  2   Para  1   Term  1   Preterm      AB      Living  1      SAB      IAB      Ectopic      Multiple  0   Live Births  1           Social History: Social History   Socioeconomic History   Marital status: Married    Spouse name: Not on file   Number of children: Not on file   Years of education: Not on file   Highest education level: Not on file  Occupational History   Not on file  Tobacco Use   Smoking status: Never   Smokeless tobacco: Never  Vaping Use   Vaping Use: Never used  Substance and Sexual Activity   Alcohol use: Not Currently    Comment: Last drink January 2023   Drug use: Never   Sexual activity: Yes    Partners: Male    Birth control/protection: None  Other Topics Concern   Not on file  Social History Narrative   Not on file   Social Determinants of Health   Financial Resource Strain: Not on file  Food Insecurity: No Food Insecurity (10/11/2021)   Hunger Vital Sign    Worried About Running Out of Food in the Last  Year: Never true    Ran Out of Food in the Last Year: Never true  Transportation Needs: No Transportation Needs (10/11/2021)   PRAPARE - 12/11/2021 (Medical): No    Lack of Transportation (Non-Medical): No  Physical Activity: Not on file  Stress: Not on file  Social Connections: Not on file    Family History: Family History  Problem Relation Age of Onset   Diabetes Mother    Hypertension Maternal Grandfather    Dementia Maternal Grandfather     Allergies: No Known Allergies  Medications Prior to Admission  Medication Sig Dispense Refill Last Dose   prenatal vitamin w/FE, FA (PRENATAL 1 + 1) 27-1 MG TABS tablet Take 1 tablet by mouth daily at 12 noon. 30 tablet 11 10/10/2021     Review of Systems:  All systems reviewed and negative except as stated in HPI  PE: Blood pressure 120/79, pulse 94, temperature 98.3 F (36.8 C), temperature source Oral, resp. rate 16, height 5\' 4"  (1.626 m), weight 96 kg, last menstrual period 01/11/2021, not currently breastfeeding. General appearance: alert Lungs: regular rate and effort Heart: regular rate  Abdomen: soft, non-tender Extremities: Homans sign is negative, no sign of DVT Presentation: cephalic EFM: [id number] variability/ 10x10 accels/ Variable decels  Toco: q10 Dilation: 2 Effacement (%): 50 Station: -2, -3 Exam by:: Martinique Turner RN   Prenatal labs: ABO, Rh: --/--/B POS (10/11 0826) Antibody: NEG (10/11 0826) Rubella: 1.68 (04/12 1542) RPR: Non Reactive (07/19 1137)  HBsAg: Negative (04/12 1542)  HIV: Non Reactive (07/19 1137)  GBS: Negative/-- (09/20 1634)  2 hr GTT normal  Prenatal Transfer Tool  Maternal Diabetes: No Genetic Screening: Normal Maternal Ultrasounds/Referrals: Normal Fetal Ultrasounds or other Referrals:  None Maternal Substance Abuse:  No Significant Maternal Medications:  None Significant Maternal Lab Results: Group B Strep negative  Results for  orders placed or performed during the hospital encounter of 10/11/21 (from the past 24 hour(s))  CBC   Collection Time: 10/11/21  7:58 AM  Result Value Ref Range   WBC 8.9 4.0 - 10.5 K/uL   RBC 3.90 3.87 - 5.11 MIL/uL   Hemoglobin 11.4 (L) 12.0 - 15.0 g/dL   HCT 35.5 (L) 36.0 - 46.0 %   MCV 91.0 80.0 - 100.0 fL   MCH 29.2 26.0 - 34.0 pg   MCHC 32.1 30.0 - 36.0 g/dL   RDW 14.8 11.5 - 15.5 %   Platelets 326 150 - 400 K/uL   nRBC 0.0 0.0 - 0.2 %  Type and screen   Collection Time: 10/11/21  8:26 AM  Result Value Ref Range   ABO/RH(D) B POS    Antibody Screen NEG    Sample Expiration      10/14/2021,2359 Performed at [facility], [place] [address] [place], [place] [address]     Patient Active Problem List   Diagnosis Date Noted   IUP (intrauterine pregnancy), incidental 10/11/2021   History of shoulder dystocia in prior pregnancy, currently pregnant in third trimester 10/04/2021   Short interval between pregnancies affecting pregnancy in third trimester, antepartum 10/04/2021   BMI 36.0-36.9,adult 10/04/2021   Obesity in pregnancy, antepartum, third trimester 10/04/2021   Supervision of high risk pregnancy, antepartum, third trimester 03/15/2021    Assessment: Madeline Cooper is a 31 y.o. G2P1001 at [redacted]w[redacted]d here for IOL  1. Labor: Progressing in early latent. Cytotec administered, recheck in 4 hours  2. FWB: Cat 2 3. Pain: None 4. GBS: Negative   Plan: Admit to L&D  [name], DO  10/11/2021, 9:43 AM  Attestation of Supervision of Student:  I confirm that I have verified the information documented in the resident's note and that I have also personally  reviewed and coordinated  the history, physical exam and all medical decision making activities.  I have verified that all services and findings are accurately documented in this student's note; and I agree with management and plan as outlined in the documentation. I have also made any necessary editorial  changes.  Darlina Rumpf, [place] for [employer], [place] [employer] 10/11/2021 11:14 AM

## 2021-10-11 NOTE — Discharge Instructions (Signed)

## 2021-10-11 NOTE — Discharge Summary (Signed)
Postpartum Discharge Summary     Patient Name: Madeline Cooper DOB: [birth year] MRN: [id number]  Date of admission: 10/11/2021 Delivery date:10/11/2021  Delivering provider: Darlina Rumpf  Date of discharge: 10/12/2021  Admitting diagnosis: IUP (intrauterine pregnancy), incidental [Z33.1] Intrauterine pregnancy: [redacted]w[redacted]d     Secondary diagnosis:  Principal Problem:   IUP (intrauterine pregnancy), incidental Active Problems:   Encounter for IUD insertion  Additional problems: N/A    Discharge diagnosis: Term Pregnancy Delivered                                              Post partum procedures: N/A Augmentation: AROM, Pitocin, and Cytotec Complications: None  Hospital course: Induction of Labor With Vaginal Delivery   31 y.o. yo G2P1001 at [redacted]w[redacted]d was admitted to the hospital 10/11/2021 for induction of labor.  Indication for induction:  hx of shoulder dystocia .  Patient had an uncomplicated labor course Membrane Rupture Time/Date: 5:03 PM ,10/11/2021   Delivery Method:Vaginal, Spontaneous  Episiotomy: None  Lacerations:  None  Details of delivery can be found in separate delivery note.  Patient had a postpartum course remarkable for postplacental IUD insertion- tolerated well. Patient is discharged home 10/12/21.  Newborn Data: Birth date:10/11/2021  Birth time:6:35 PM  Gender:Female  Living status:Living  Apgars:9 ,9  Weight:3430 g (7lb 9oz)  Magnesium Sulfate received: No BMZ received: No Rhophylac:N/A MMR:N/A T-DaP:Given prenatally Flu: Offered postpartum Transfusion:No  Physical exam  Vitals:   10/12/21 0145 10/12/21 0200 10/12/21 0522 10/12/21 0833  BP: 114/68 118/64 124/83 117/87  Pulse: 88 65 64 66  Resp: $Remo'16 16 16 18  'KJAuV$ Temp: 98.6 F (37 C)  97.9 F (36.6 C) 97.6 F (36.4 C)  TempSrc: Oral  Oral   SpO2: 98% 96% 97% 98%  Weight:      Height:       General: alert, cooperative, and no distress Lochia: appropriate Uterine Fundus:  firm Incision: N/A DVT Evaluation: No evidence of DVT seen on physical exam. Labs: Lab Results  Component Value Date   WBC 12.1 (H) 10/12/2021   HGB 11.7 (L) 10/12/2021   HCT 34.5 (L) 10/12/2021   MCV 88.2 10/12/2021   PLT 276 10/12/2021      Latest Ref Rng & Units 08/23/2021    4:43 PM  CMP  Glucose 70 - 99 mg/dL 89   BUN 6 - 20 mg/dL 6   Creatinine 0.57 - 1.00 mg/dL 0.45   Sodium 134 - 144 mmol/L 142   Potassium 3.5 - 5.2 mmol/L 3.9   Chloride 96 - 106 mmol/L 107   CO2 20 - 29 mmol/L 18   Calcium 8.7 - 10.2 mg/dL 9.7   Total Protein 6.0 - 8.5 g/dL 6.2   Total Bilirubin 0.0 - 1.2 mg/dL <0.2   Alkaline Phos 44 - 121 IU/L 59   AST 0 - 40 IU/L 11   ALT 0 - 32 IU/L 5    Edinburgh Score:    10/11/2021    8:50 PM  Edinburgh Postnatal Depression Scale Screening Tool  I have been able to laugh and see the funny side of things. 0  I have looked forward with enjoyment to things. 0  I have blamed myself unnecessarily when things went wrong. 1  I have been anxious or worried for no good reason. 0  I have felt scared or  panicky for no good reason. 0  Things have been getting on top of me. 0  I have been so unhappy that I have had difficulty sleeping. 0  I have felt sad or miserable. 0  I have been so unhappy that I have been crying. 0  The thought of harming myself has occurred to me. 0  Edinburgh Postnatal Depression Scale Total 1     After visit meds:  Allergies as of 10/12/2021   No Known Allergies      Medication List     TAKE these medications    ibuprofen 600 MG tablet Commonly known as: ADVIL Take 1 tablet (600 mg total) by mouth every 6 (six) hours as needed.   prenatal vitamin w/FE, FA 27-1 MG Tabs tablet Take 1 tablet by mouth daily at 12 noon.         Discharge home in stable condition Infant Feeding: Breast Infant Disposition:home with mother Discharge instruction: per After Visit Summary and Postpartum booklet. Activity: Advance as  tolerated. Pelvic rest for 6 weeks.  Diet: routine diet Future Appointments: Future Appointments  Date Time Provider [place]  10/13/2021 10:15 AM Marjo Bicker [facility]  11/30/2021 10:55 AM Deloris Ping, CNM [facility] None   Follow up Visit: Message sent by Maryelizabeth Kaufmann, CNM on 10/11/2021  Please schedule this patient for a In person postpartum visit in 4 weeks with the following provider: Any provider. Additional Postpartum F/U: IUD string check   Low risk pregnancy complicated by:  hx of shoulder dystocia Delivery mode:  Vaginal, Spontaneous  Anticipated Birth Control:  PP IUD placed   10/12/2021 Myrtis Ser, CNM 9:27 AM

## 2021-10-11 NOTE — Progress Notes (Signed)
Labor Progress Note Madeline Cooper is a 31 y.o. G2P1001 at [redacted]w[redacted]d presented for IOL S: Comfortable with epidural.   O:  BP 130/78   Pulse 80   Temp 98.3 F (36.8 C) (Oral)   Resp 17   Ht 5\' 4"  (1.626 m)   Wt 96 kg   LMP 01/11/2021   BMI 36.33 kg/m  EFM: 144bpm/Moderate variability/ 15x15 accels/ None decels   CVE: Dilation: 5 Effacement (%): 60 Cervical Position: Posterior Station: -2 Presentation: Vertex Exam by:: dr Kerrie Pleasure   A&P: 31 y.o. G2P1001 [redacted]w[redacted]d IOL #Labor: Progressing well. Epidural in place contraction q1-2 m. Making cervical change AROM @ 1700.  #Pain: Epidural and family support #FWB: CAT 1  #GBS negative   [name], DO 5:06 PM

## 2021-10-11 NOTE — Anesthesia Procedure Notes (Signed)
Epidural Patient location during procedure: OB Start time: 10/11/2021 3:57 PM End time: 10/11/2021 4:03 PM  Staffing Anesthesiologist: Janeece Riggers, MD  Preanesthetic Checklist Completed: patient identified, IV checked, site marked, risks and benefits discussed, surgical consent, monitors and equipment checked, pre-op evaluation and timeout performed  Epidural Patient position: sitting Prep: DuraPrep and site prepped and draped Patient monitoring: continuous pulse ox and blood pressure Approach: midline Location: L3-L4 Injection technique: LOR air  Needle:  Needle type: Tuohy  Needle gauge: 17 G Needle length: 9 cm and 9 Needle insertion depth: 5 cm Catheter type: closed end flexible Catheter size: 19 Gauge Catheter at skin depth: 12 cm Test dose: negative  Assessment Events: blood not aspirated, injection not painful, no injection resistance, no paresthesia and negative IV test

## 2021-10-11 NOTE — Procedures (Addendum)
  Post-Placental Paragard IUD Insertion Procedure Note  Patient identified, informed consent signed prior to delivery, signed copy in chart, time out was performed.    - Vaginal, labial and perineal areas thoroughly inspected for lacerations. Intact perineum.   - IUD grasped between sterile gloved fingers. Sterile lubrication applied to sterile gloved hand for ease of insertion. Fundus identified through abdominal wall using non-insertion hand. IUD inserted to fundus with bimanual technique. IUD carefully released at the fundus and insertion hand gently removed from vagina.   Patient tolerated procedure well.  Patient given post procedure instructions and IUD care card with expiration date.  Patient is asked to keep IUD strings tucked in her vagina until her postpartum follow up visit in 4-6 weeks. Patient advised to abstain from sexual intercourse and pulling on strings before her follow-up visit. Patient verbalized an understanding of the plan of care and agrees.   Mallie Snooks, [place], MSN, CNM Certified Nurse Midwife, Product/process development scientist for [employer], [employer]

## 2021-10-11 NOTE — Lactation Note (Signed)
This note was copied from a baby's chart. Lactation Consultation Note  Patient Name: Girl Samanthajo Payano ASNKN'L Date: 10/11/2021 Reason for consult: Initial assessment;Term Age:30 hours Mom fixing to eat supper. Mom stated baby latched in L&D. Mom stated it went ok. Asked mom to call for [facility] tonight to assist in latching. Newborn feeding habits, STS, I&O, reviewed. Mom encouraged to feed baby 8-12 times/24 hours and with feeding cues.  Left mom to eat. Encouraged to feed baby q 3 hr if hasn't cued to feed before then.  Maternal Data Does the patient have breastfeeding experience prior to this delivery?: Yes How long did the patient breastfeed?: 1 1/2 months  Feeding Mother's Current Feeding Choice: Breast Milk and Formula  LATCH Score Latch: Grasps breast easily, tongue down, lips flanged, rhythmical sucking.  Audible Swallowing: Spontaneous and intermittent  Type of Nipple: Everted at rest and after stimulation  Comfort (Breast/Nipple): Soft / non-tender  Hold (Positioning): Assistance needed to correctly position infant at breast and maintain latch.  LATCH Score: 9   Lactation Tools Discussed/Used    Interventions Interventions: LC Services brochure;Breast feeding basics reviewed  Discharge    Consult Status Consult Status: Follow-up Date: 10/12/21 Follow-up type: In-patient    Theodoro Kalata 10/11/2021, 9:43 PM

## 2021-10-12 LAB — CBC
HCT: 34.5 % — ABNORMAL LOW (ref 36.0–46.0)
Hemoglobin: 11.7 g/dL — ABNORMAL LOW (ref 12.0–15.0)
MCH: 29.9 pg (ref 26.0–34.0)
MCHC: 33.9 g/dL (ref 30.0–36.0)
MCV: 88.2 fL (ref 80.0–100.0)
Platelets: 276 10*3/uL (ref 150–400)
RBC: 3.91 MIL/uL (ref 3.87–5.11)
RDW: 14.8 % (ref 11.5–15.5)
WBC: 12.1 10*3/uL — ABNORMAL HIGH (ref 4.0–10.5)
nRBC: 0 % (ref 0.0–0.2)

## 2021-10-12 MED ORDER — IBUPROFEN 600 MG PO TABS: 600.0000 mg | ORAL_TABLET | Freq: Four times a day (QID) | ORAL | 0 refills | Status: DC | PRN

## 2021-10-12 NOTE — Anesthesia Postprocedure Evaluation (Signed)
Anesthesia Post Note  Patient: Madeline Cooper  Procedure(s) Performed: AN AD [place]     Patient location during evaluation: Mother Baby Anesthesia Type: Epidural Level of consciousness: awake and alert and oriented Pain management: satisfactory to patient Vital Signs Assessment: post-procedure vital signs reviewed and stable Respiratory status: respiratory function stable Cardiovascular status: stable Postop Assessment: no headache, no backache, epidural receding, patient able to bend at knees, no signs of nausea or vomiting, adequate PO intake and able to ambulate Anesthetic complications: no   No notable events documented.  Last Vitals:  Vitals:   10/12/21 0200 10/12/21 0522  BP: 118/64 124/83  Pulse: 65 64  Resp: 16 16  Temp:  36.6 C  SpO2: 96% 97%    Last Pain:  Vitals:   10/12/21 0522  TempSrc: Oral  PainSc: 0-No pain   Pain Goal:                   Starlin Steib

## 2021-10-13 ENCOUNTER — Telehealth: Payer: Self-pay | Admitting: Physical Therapy

## 2021-10-13 ENCOUNTER — Ambulatory Visit: Payer: Medicaid Other | Admitting: Physical Therapy

## 2021-10-13 NOTE — Telephone Encounter (Signed)
Called pt regarding no show for 1015 appt - pt answered, states she thought remaining visits were already cancelled. Advised on need for new referral if she aims to return to PT due to change in medical status, pt verbalizes understanding

## 2021-10-18 ENCOUNTER — Encounter: Payer: Medicaid Other | Admitting: Obstetrics and Gynecology

## 2021-10-21 ENCOUNTER — Telehealth (HOSPITAL_COMMUNITY): Payer: Self-pay

## 2021-10-21 NOTE — Telephone Encounter (Signed)
Patient did not answer phone call. Mailbox is full and cannot except any messages.   [name] [facility] 10/21/21,0902

## 2021-11-30 ENCOUNTER — Ambulatory Visit: Payer: Medicaid Other | Admitting: Certified Nurse Midwife

## 2021-11-30 DIAGNOSIS — Z30431 Encounter for routine checking of intrauterine contraceptive device: Secondary | ICD-10-CM

## 2022-07-17 IMAGING — US US MFM OB FOLLOW-UP
1 series · 14 of 28 positions shown · non-contrast
Comparison: none

[Series 1: us mfm ob follow-up · 30 acquisitions, 14 frames shown]
[im 2/30]
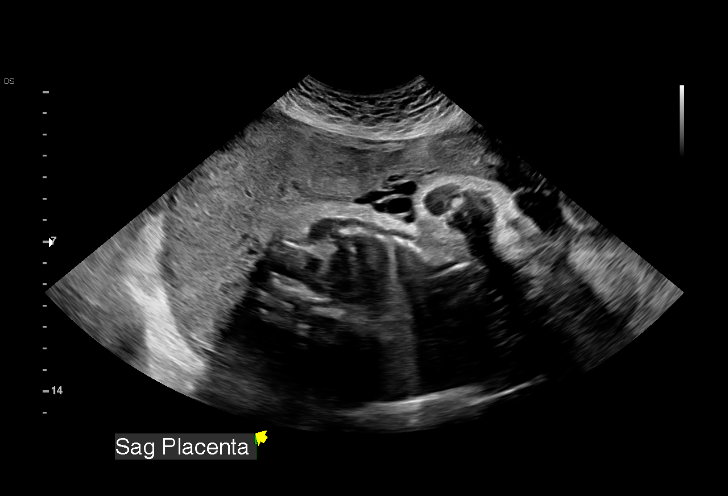
[im 4/30]
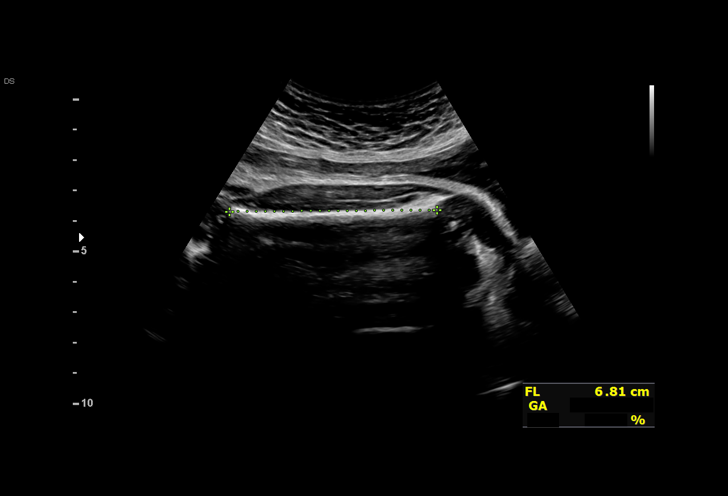
[im 6/30]
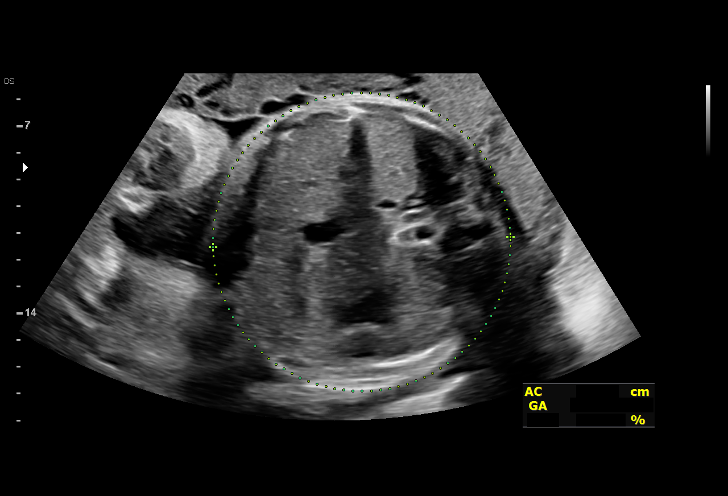
[im 8/30]
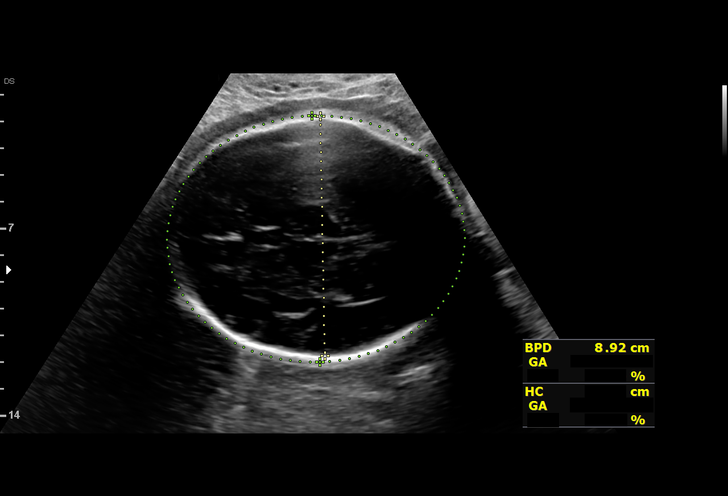
[im 10/30]
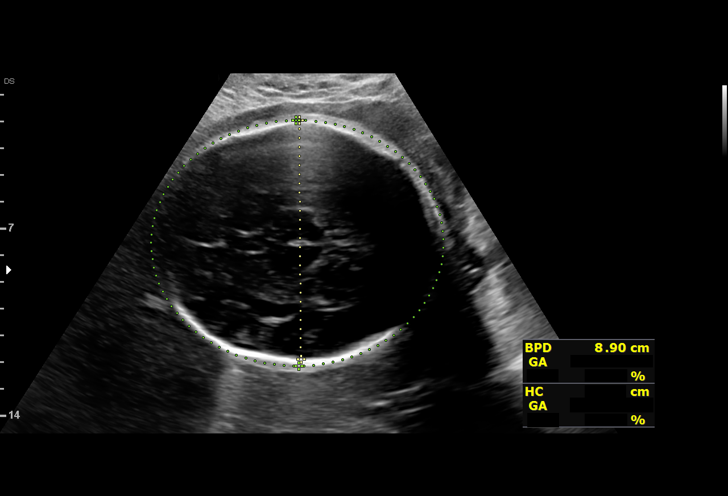
[im 12/30]
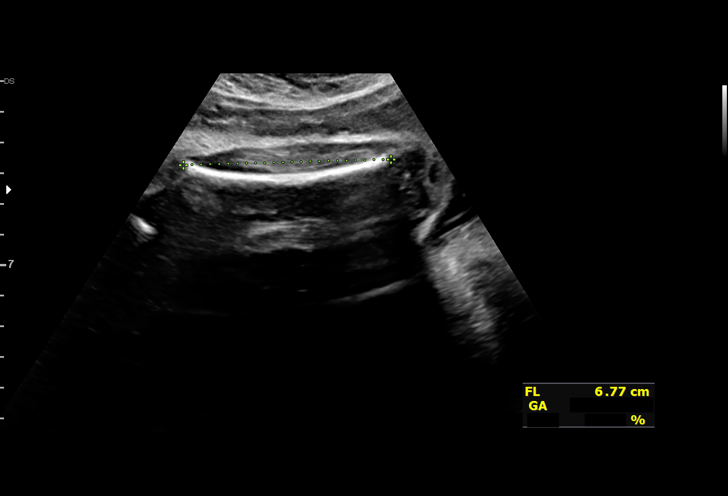
[im 14/30]
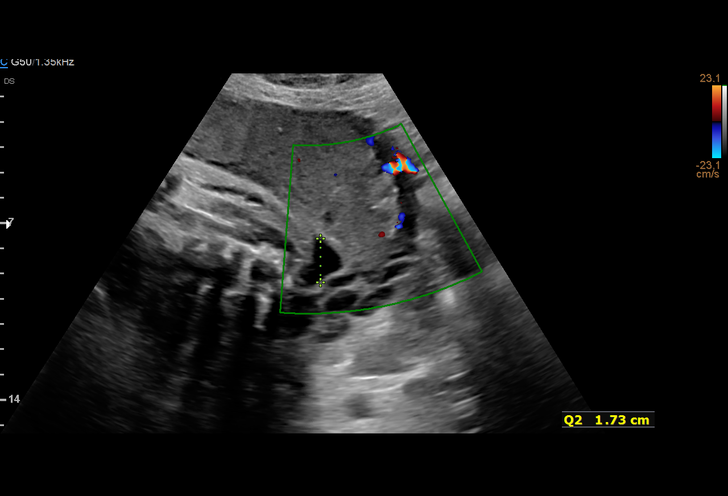
[im 17/30]
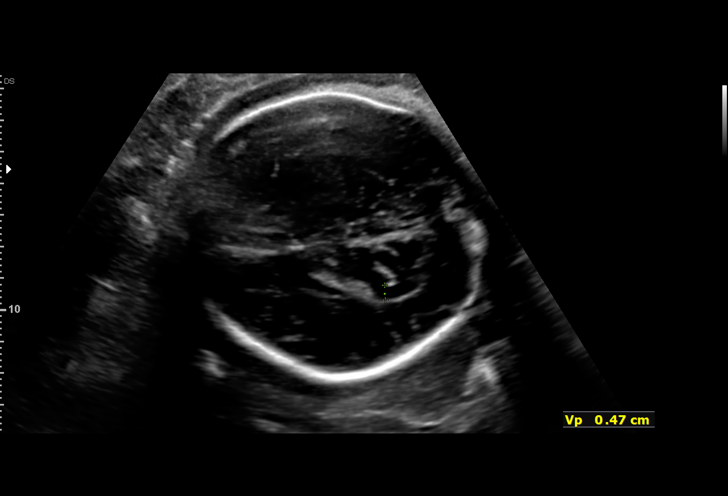
[im 19/30]
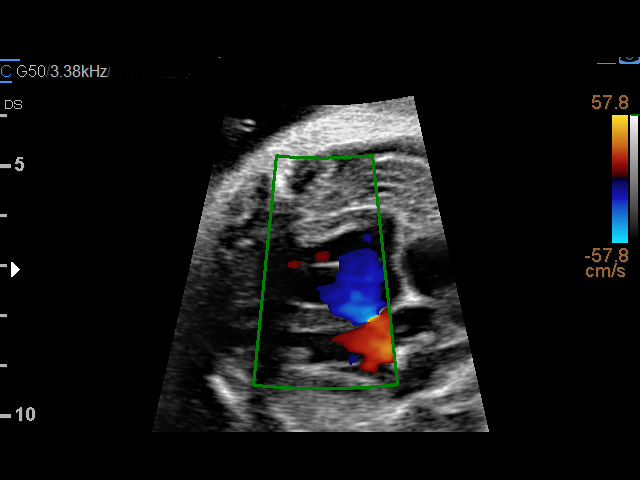
[im 21/30]
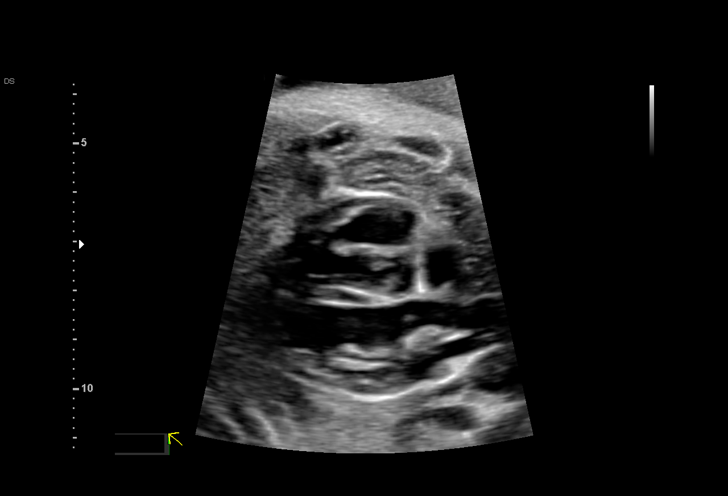
[im 23/30]
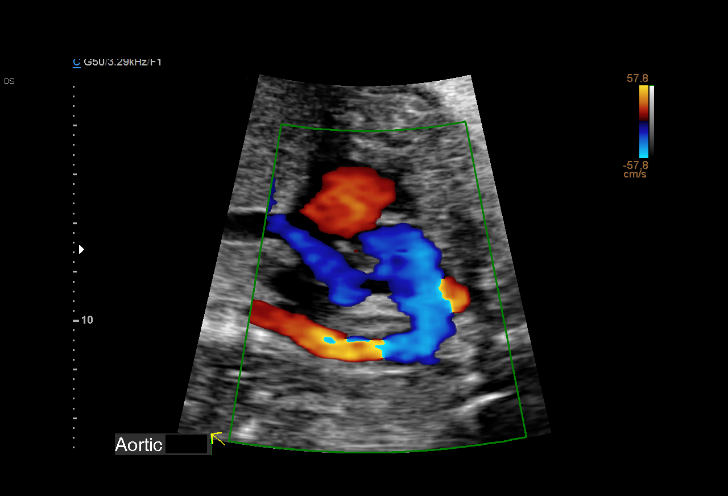
[im 25/30]
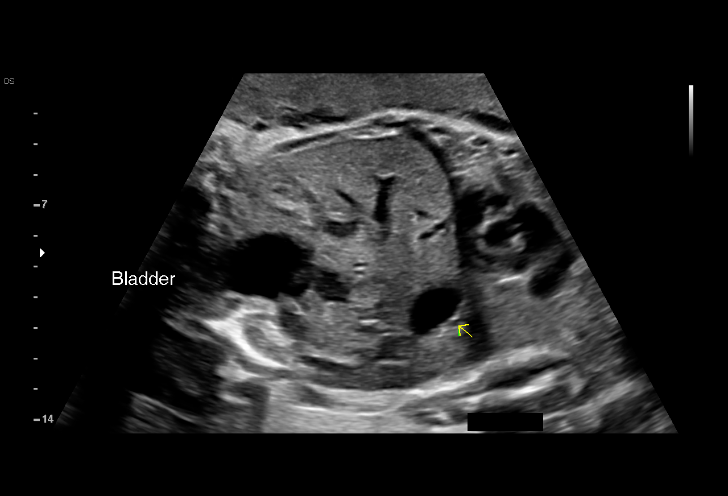
[im 27/30]
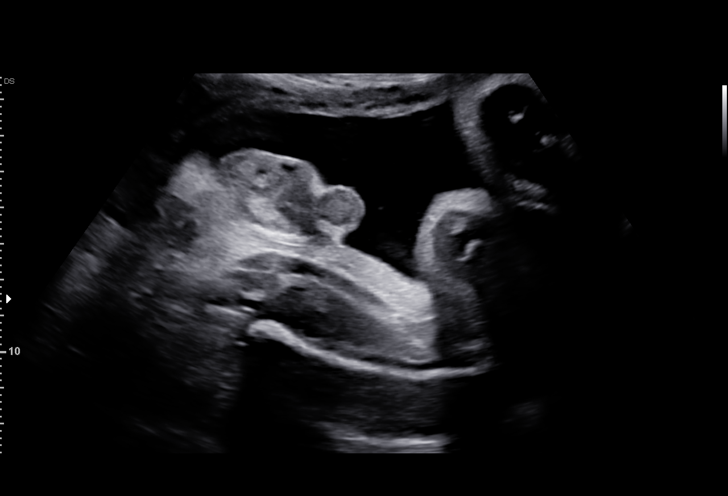
[im 30/30]
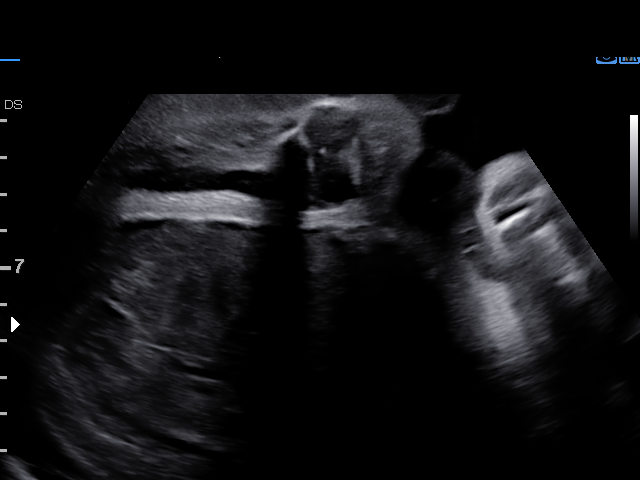

[14 of 28 positions shown; findings below may reference images not displayed]

Indications

 Obesity complicating pregnancy, second
 trimester BMI 34
 Encounter for other antenatal screening
 follow-up
 34 weeks gestation of pregnancy
Fetal Evaluation

 Num Of Fetuses:         1
 Cardiac Activity:       Observed
 Presentation:           Cephalic
 Placenta:               Anterior
 P. Cord Insertion:      Visualized, central

 Amniotic Fluid
 AFI FV:      Within normal limits

 AFI Sum(cm)     %Tile       Largest Pocket(cm)
 16.23           59

 RUQ(cm)       RLQ(cm)       LUQ(cm)        LLQ(cm)

Biometry

 BPD:      89.1  mm     G. Age:  36w 0d         85  %    CI:        79.43   %    70 - 86
                                                         FL/HC:      21.4   %    20.1 -
 HC:       316   mm     G. Age:  35w 3d         34  %    HC/AC:      0.91        0.93 -
 AC:      345.9  mm     G. Age:  38w 3d       > 99  %    FL/BPD:     75.8   %    71 - 87
 FL:       67.5  mm     G. Age:  34w 5d         41  %    FL/AC:      19.5   %    20 - 24

 LV:        4.7  mm

 Est. FW:    4444  gm    6 lb 13 oz      96  %
Gestational Age

 U/S Today:     36w 1d                                        EDD:   09/01/20
 Best:          34w 5d     Det. By:  U/S C R L  (02/23/20)    EDD:   09/11/20
Anatomy

 Cranium:               Appears normal         Aortic Arch:            Appears normal
 Cavum:                 Appears normal         Stomach:                Appears normal, left
                                                                       sided
 Ventricles:            Appears normal         Kidneys:                Appear normal
 Heart:                 Appears normal         Bladder:                Appears normal
                        (4CH, axis, and
                        situs)
 RVOT:                  Appears normal         Spine:                  Not well visualized
 LVOT:                  Appears normal
Comments

 This patient was seen for a follow up growth scan as a large
 for gestational age fetus was noted during her prior
 ultrasound exams.  She has screened negative for
 gestational diabetes in her current pregnancy.
 She was informed that the fetal growth continues to measure
 large for her gestational [AGE] g, 96 percentile).  There
 was normal amniotic fluid noted.
 She was advised that based on current ACOG
 recommendations, a vaginal delivery may be attempted in
 someone without diabetes as long as the overall EFW is
 3444 g or less.
 We will reassess the fetal weight again in 4 weeks (at 38
 weeks) and will make a recommendation regarding the safest
 mode of delivery based on the EFW obtained at her next
 ultrasound exam.
Recommendations

 Repeat ultrasound in 4 weeks (at 38 weeks) to assess the
 fetal growth

## 2022-08-14 IMAGING — US US MFM OB FOLLOW-UP
1 series · 13 of 28 positions shown · non-contrast
Comparison: none

[Series 1: us mfm ob follow-up · 60 acquisitions, 13 frames shown]
[im 3/60]
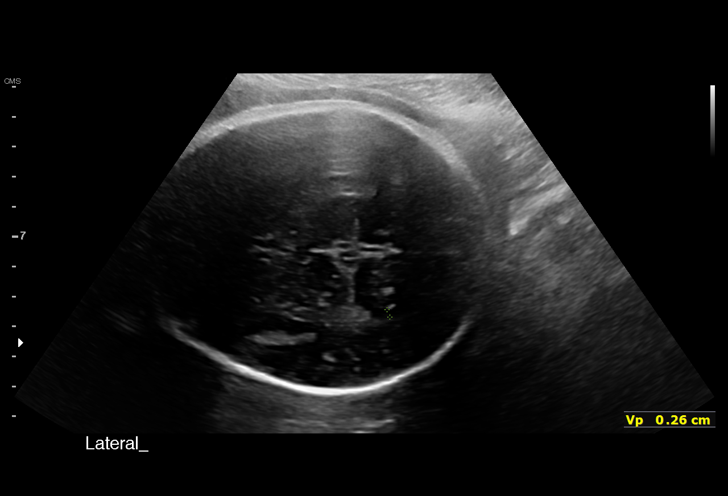
[im 7/60]
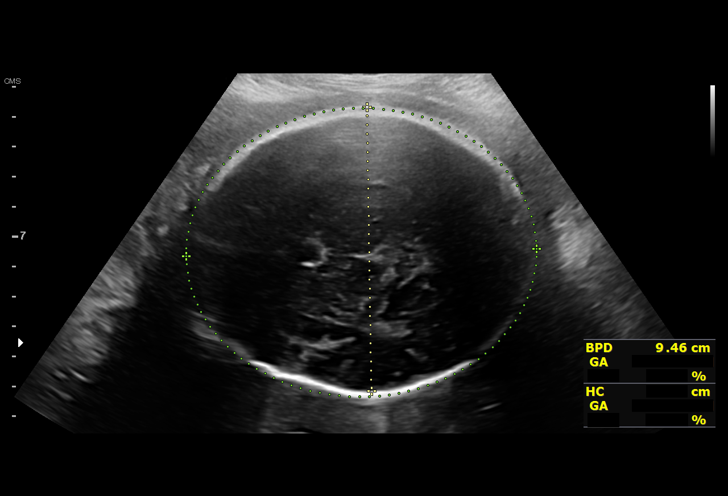
[im 11/60]
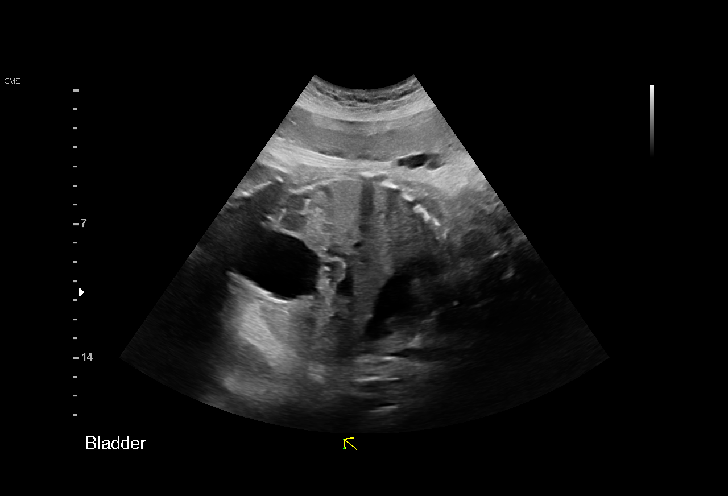
[im 16/60]
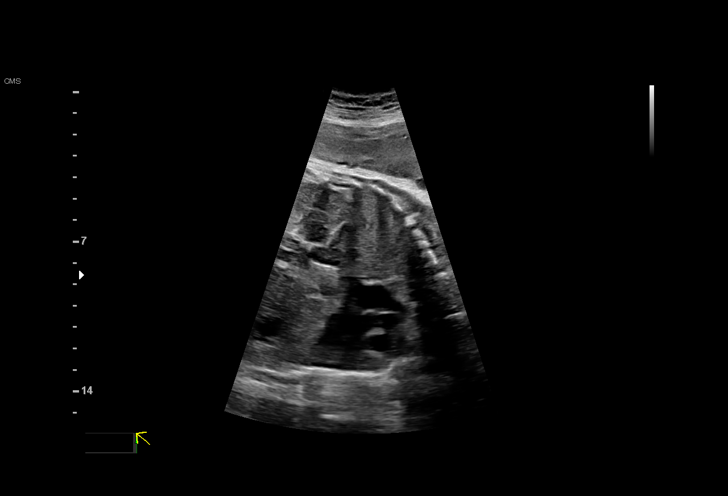
[im 20/60]
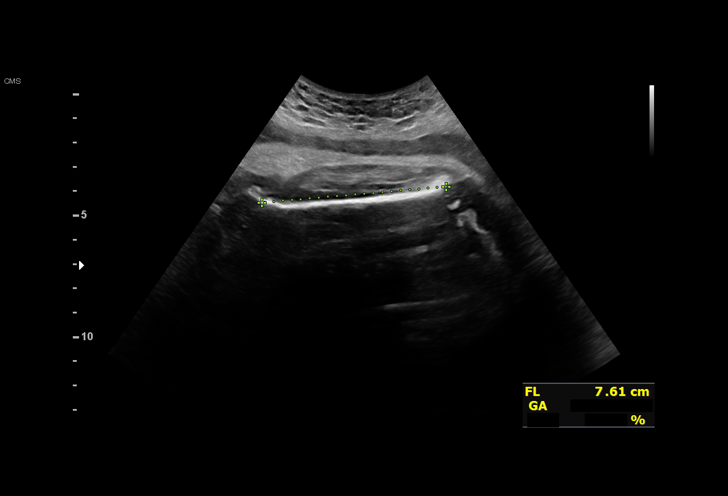
[im 25/60]
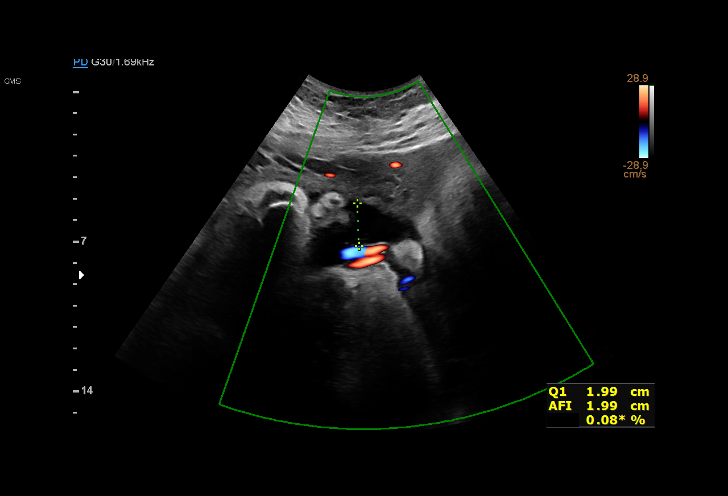
[im 31/60]
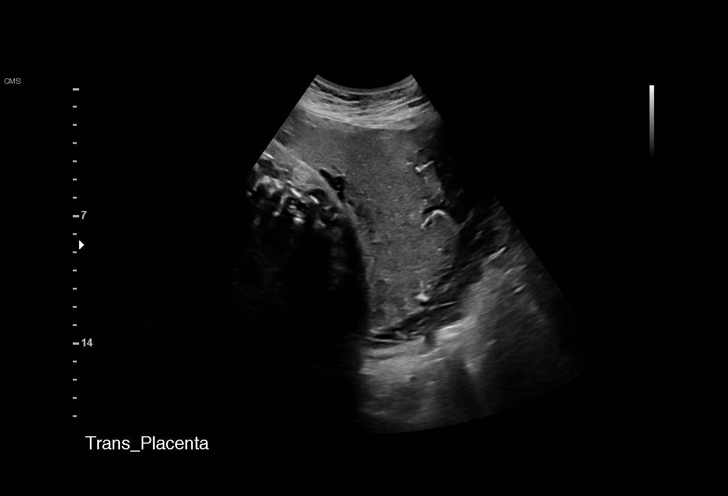
[im 35/60]
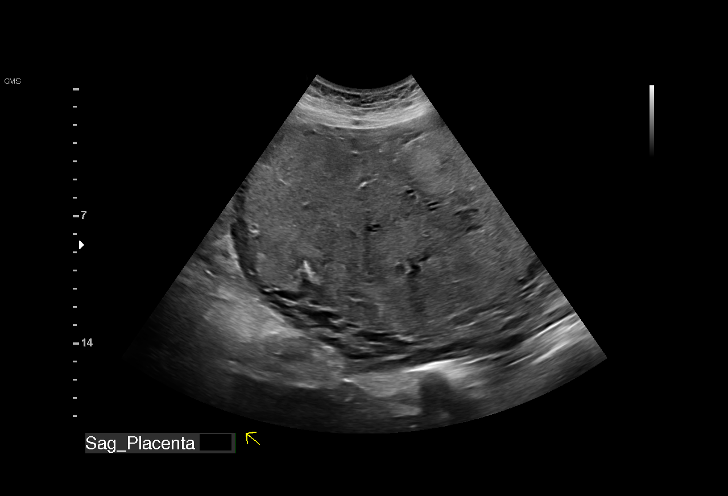
[im 40/60]
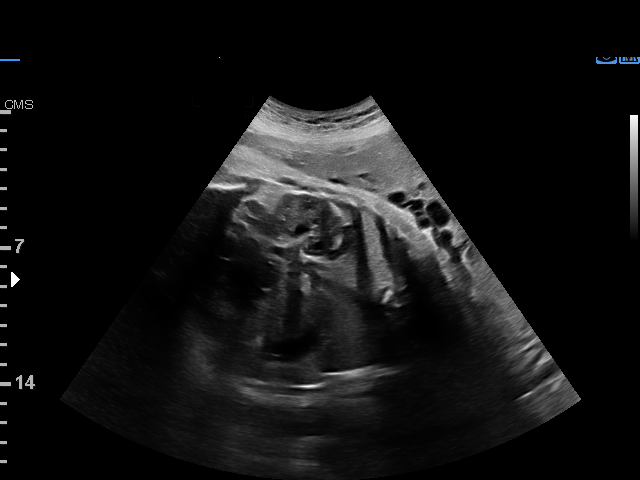
[im 44/60]
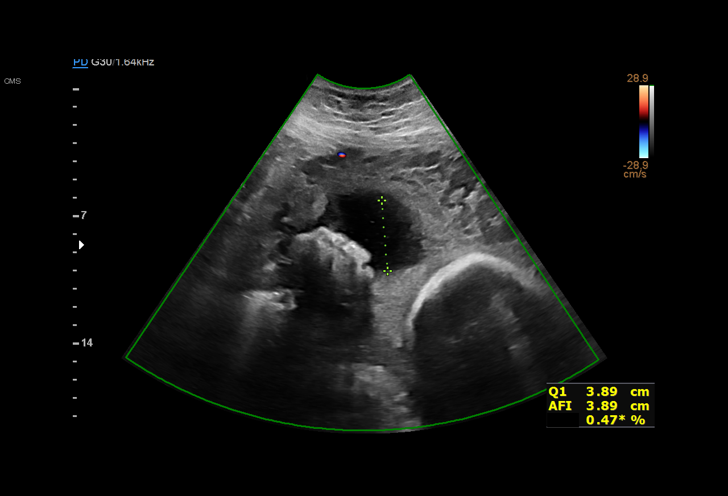
[im 49/60]
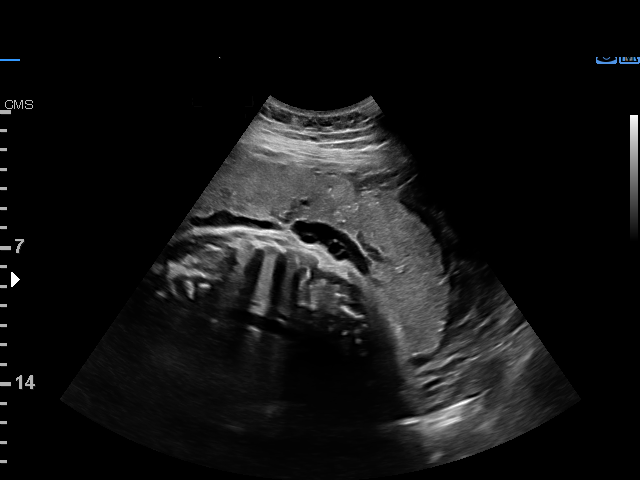
[im 53/60]
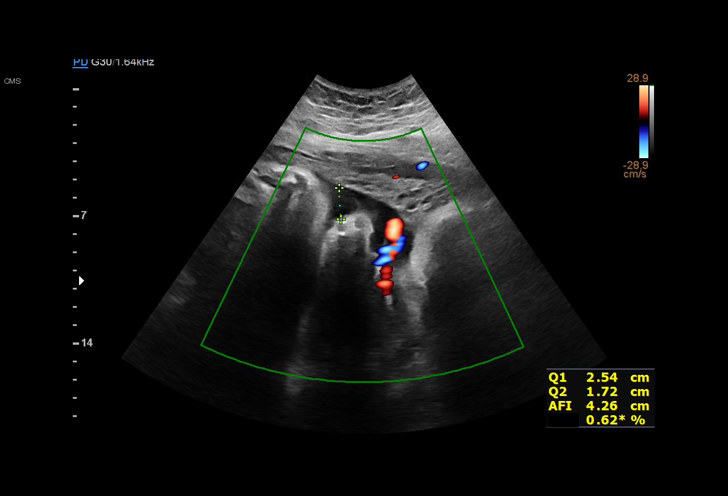
[im 57/60]
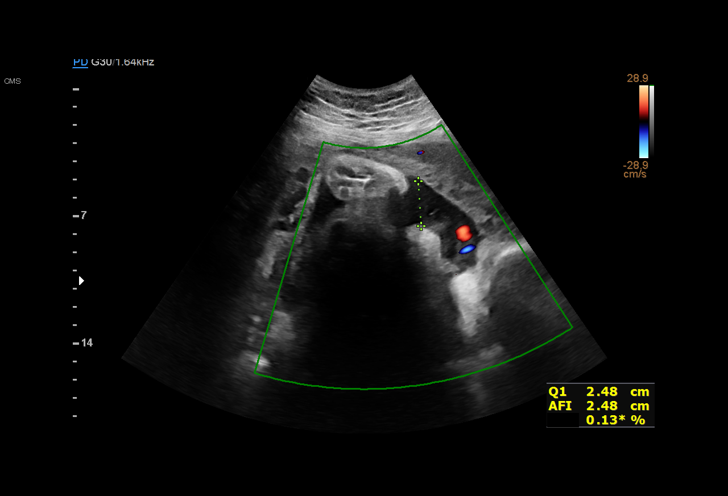

[13 of 28 positions shown; findings below may reference images not displayed]

Indications

 38 weeks gestation of pregnancy
 Encounter for other antenatal screening
 follow-up
 Large for gestational age fetus affecting
 management of mother
 Oligohydramnios / Decreased amniotic fluid
 volume
Fetal Evaluation

 Num Of Fetuses:         1
 Fetal Heart Rate(bpm):  143
 Cardiac Activity:       Observed
 Presentation:           Cephalic
 Placenta:               Anterior Left
 P. Cord Insertion:      Previously Visualized

 Amniotic Fluid
 AFI FV:      Oligohydramnios

 AFI Sum(cm)     %Tile       Largest Pocket(cm)
 5.3             < 3

 RUQ(cm)       RLQ(cm)       LUQ(cm)

Biophysical Evaluation

 Amniotic F.V:   Pocket => 2 cm             F. Tone:        Observed
 F. Movement:    Observed                   Score:          [DATE]
 F. Breathing:   Observed
Biometry

 BPD:      94.7  mm     G. Age:  38w 4d         84  %    CI:        77.87   %    70 - 86
                                                         FL/HC:      22.3   %    20.9 -
 HC:      339.6  mm     G. Age:  39w 0d         53  %    HC/AC:      0.92        0.92 -
 AC:      369.3  mm     G. Age:  40w 6d       > 99  %    FL/BPD:     80.1   %    71 - 87
 FL:       75.9  mm     G. Age:  38w 6d         70  %    FL/AC:      20.6   %    20 - 24
 HUM:      64.2  mm     G. Age:  37w 2d         60  %

 LV:        2.6  mm

 Est. FW:    9594  gm    8 lb 11 oz      95  %
OB History

 Gravidity:    1         Term:   0        Prem:   0        SAB:   0
 TOP:          0       Ectopic:  0        Living: 0
Gestational Age

 LMP:           38w 1d        Date:  12/10/19                 EDD:   09/15/20
 U/S Today:     39w 2d                                        EDD:   09/07/20
 Best:          38w 1d     Det. By:  LMP  (12/10/19)          EDD:   09/15/20
Anatomy

 Cranium:               Appears normal         LVOT:                   Appears normal
 Cavum:                 Appears normal         Aortic Arch:            Previously seen
 Ventricles:            Appears normal         Ductal Arch:            Previously seen
 Choroid Plexus:        Previously seen        Diaphragm:              Previously seen
 Cerebellum:            Appears normal         Stomach:                Appears normal, left
                                                                       sided
 Posterior Fossa:       Appears normal         Abdomen:                Previously seen
 Nuchal Fold:           Previously seen        Abdominal Wall:         Previously seen
 Face:                  Orbits and profile     Cord Vessels:           Previously seen
                        previously seen
 Lips:                  Previously seen        Kidneys:                Appear normal
 Palate:                Previously seen        Bladder:                Appears normal
 Thoracic:              Appears normal         Spine:                  Limited views
                        Previously seen                                previously seen
 Heart:                 Previously seen        Upper Extremities:      Previously seen
 RVOT:                  Appears normal         Lower Extremities:      Previously seen

 Other:  Male gender previously seen. Nasal Bone, VC, 3VV, 3VTV, Heels,
         and 5th digit visualized previously. Technically difficult due to fetal
         position.
Cervix Uterus Adnexa

 Cervix
 Not visualized (advanced GA >70wks)
 Uterus
 No abnormality visualized.

 Right Ovary
 Not visualized.

 Left Ovary
 Not visualized.

 Cul De Sac
 No free fluid seen.

 Adnexa
 No abnormality visualized.
Impression

 Patient returned for fetal growth assessment.  She does not
 have gestational diabetes.

 On today's ultrasound, the estimated fetal weight is at the
 95th percentile.  Oligohydramnios (defined as AFI 5 cm or
 less) is seen and the maximum vertical pocket measures 3
 cm.  Cephalic presentation.  Antenatal testing is reassuring.
 BPP [DATE].

 I explained the finding of oligohydramnios.  However,
 maximum vertical pocket of greater than 2 cm is not
 associated with adverse fetal outcomes.
 I recommended delivery at 39 weeks gestation.

 Ultrasound has limitations in accurately estimating fetal
 weights.

 Blood pressure today at her office is 119/68 mmHg.
Recommendations

 Delivery at 39 weeks.
                 S[place]acaille, Raksha

## 2022-10-04 ENCOUNTER — Ambulatory Visit (INDEPENDENT_AMBULATORY_CARE_PROVIDER_SITE_OTHER): Payer: Medicaid Other | Admitting: Emergency Medicine

## 2022-10-04 VITALS — BP 112/71 | HR 81 | Ht 64.0 in | Wt 210.8 lb

## 2022-10-04 DIAGNOSIS — N912 Amenorrhea, unspecified: Secondary | ICD-10-CM

## 2022-10-04 DIAGNOSIS — Z3201 Encounter for pregnancy test, result positive: Secondary | ICD-10-CM | POA: Diagnosis not present

## 2022-10-04 LAB — POCT URINE PREGNANCY: Preg Test, Ur: POSITIVE — AB

## 2022-10-04 NOTE — Progress Notes (Signed)
Madeline Cooper presents today for UPT. She has no unusual complaints. LMP: 08/21/2022    OBJECTIVE: Appears well, in no apparent distress.  OB History     Gravida  2   Para  2   Term  2   Preterm      AB      Living  2      SAB      IAB      Ectopic      Multiple  0   Live Births  2          Home UPT Result: positive In-Office UPT result: positive I have reviewed the patient's medical, obstetrical, social, and family histories, and medications.   ASSESSMENT: Positive pregnancy test  PLAN Prenatal care to be completed at: [facility]

## 2022-10-26 ENCOUNTER — Ambulatory Visit (INDEPENDENT_AMBULATORY_CARE_PROVIDER_SITE_OTHER): Payer: Medicaid Other | Admitting: *Deleted

## 2022-10-26 ENCOUNTER — Other Ambulatory Visit (HOSPITAL_COMMUNITY)
Admission: RE | Admit: 2022-10-26 | Discharge: 2022-10-26 | Disposition: A | Discharge status: Home / Self Care | Payer: Medicaid Other | Source: Ambulatory Visit | Attending: Obstetrics and Gynecology | Admitting: Obstetrics and Gynecology

## 2022-10-26 ENCOUNTER — Other Ambulatory Visit (INDEPENDENT_AMBULATORY_CARE_PROVIDER_SITE_OTHER): Payer: Self-pay

## 2022-10-26 VITALS — BP 122/83 | HR 66 | Wt 206.4 lb

## 2022-10-26 DIAGNOSIS — O0990 Supervision of high risk pregnancy, unspecified, unspecified trimester: Secondary | ICD-10-CM | POA: Insufficient documentation

## 2022-10-26 DIAGNOSIS — Z1339 Encounter for screening examination for other mental health and behavioral disorders: Secondary | ICD-10-CM | POA: Diagnosis not present

## 2022-10-26 DIAGNOSIS — O0991 Supervision of high risk pregnancy, unspecified, first trimester: Secondary | ICD-10-CM

## 2022-10-26 DIAGNOSIS — O3680X0 Pregnancy with inconclusive fetal viability, not applicable or unspecified: Secondary | ICD-10-CM

## 2022-10-26 DIAGNOSIS — Z3A08 8 weeks gestation of pregnancy: Secondary | ICD-10-CM | POA: Diagnosis not present

## 2022-10-26 NOTE — Progress Notes (Addendum)
New OB Intake  I connected with Madeline Cooper  on 10/26/22 at 10:15 AM EDT by In Person and verified that I am speaking with the correct person using two identifiers. Nurse is located at [facility] and pt is located at [place].  I discussed the limitations, risks, security and privacy concerns of performing an evaluation and management service by telephone and the availability of in person appointments. I also discussed with the patient that there may be a patient responsible charge related to this service. The patient expressed understanding and agreed to proceed.  I explained I am completing New OB Intake today. We discussed EDD of 05/28/2023, by Last Menstrual Period. Pt is G3P2002. I reviewed her allergies, medications and Medical/Surgical/OB history.    Patient Active Problem List   Diagnosis Date Noted   IUP (intrauterine pregnancy), incidental 10/11/2021   Encounter for IUD insertion 10/11/2021   History of shoulder dystocia in prior pregnancy, currently pregnant in third trimester 10/04/2021   Short interval between pregnancies affecting pregnancy in third trimester, antepartum 10/04/2021   BMI 36.0-36.9,adult 10/04/2021   Obesity in pregnancy, antepartum, third trimester 10/04/2021   Supervision of high risk pregnancy, antepartum, third trimester 03/15/2021    Concerns addressed today  Delivery Plans Plans to deliver at [facility]. Discussed the nature of our practice with multiple providers including residents and students. Due to the size of the practice, the delivering provider may not be the same as those providing prenatal care.   Patient is interested in water birth. Offered upcoming OB visit with CNM to discuss further.  MyChart/Babyscripts MyChart access verified. I explained pt will have some visits in office and some virtually. Babyscripts instructions given and order placed. Patient verifies receipt of registration text/e-mail. Account successfully created and app  downloaded.  Blood Pressure Cuff/Weight Scale Pt has BP cuff at home. Explained after first prenatal appt pt will check weekly and document in Babyscripts. Patient does not have weight scale; patient may purchase if they desire to track weight weekly in Babyscripts.  Anatomy US Explained first scheduled [place] will be around 19 weeks. Anatomy US scheduled for TBD at TBD.  Interested in [place]? If yes, send referral and doula dot phrase.   Is patient a candidate for Babyscripts Optimization? No - High Risk  First visit review I reviewed new OB appt with patient. Explained pt will be seen by Dr. Jolayne Panther at first visit. Discussed Avelina Laine genetic screening with patient. Requests Panorama and Horizon.. Routine prenatal labs  GC/CC and OB Urine only collected at this visit.    Last Pap Diagnosis  Date Value Ref Range Status  03/02/2020   Final   - Negative for intraepithelial lesion or malignancy (NILM)    Harrel Lemon, RN 10/26/2022  10:23 AM

## 2022-10-26 NOTE — Patient Instructions (Signed)

## 2022-10-28 LAB — URINE CULTURE, OB REFLEX

## 2022-10-28 LAB — CULTURE, OB URINE

## 2022-10-29 LAB — CERVICOVAGINAL ANCILLARY ONLY
Chlamydia: NEGATIVE
Comment: NEGATIVE
Comment: NORMAL
Neisseria Gonorrhea: NEGATIVE

## 2022-11-16 ENCOUNTER — Encounter: Payer: Medicaid Other | Admitting: Obstetrics and Gynecology

## 2022-12-07 ENCOUNTER — Other Ambulatory Visit: Payer: Self-pay | Admitting: *Deleted

## 2022-12-07 MED ORDER — VITAFOL ULTRA 29-0.6-0.4-200 MG PO CAPS: 1.0000 | ORAL_CAPSULE | Freq: Every day | ORAL | 11 refills | Status: DC

## 2022-12-07 NOTE — Progress Notes (Signed)
PNV ordered to pharmacy.

## 2022-12-14 ENCOUNTER — Ambulatory Visit (INDEPENDENT_AMBULATORY_CARE_PROVIDER_SITE_OTHER): Payer: Medicaid Other

## 2022-12-14 VITALS — BP 115/69 | HR 91 | Wt 208.4 lb

## 2022-12-14 DIAGNOSIS — O09892 Supervision of other high risk pregnancies, second trimester: Secondary | ICD-10-CM | POA: Diagnosis not present

## 2022-12-14 DIAGNOSIS — O09893 Supervision of other high risk pregnancies, third trimester: Secondary | ICD-10-CM

## 2022-12-14 DIAGNOSIS — O0992 Supervision of high risk pregnancy, unspecified, second trimester: Secondary | ICD-10-CM

## 2022-12-14 DIAGNOSIS — Z3A15 15 weeks gestation of pregnancy: Secondary | ICD-10-CM

## 2022-12-14 DIAGNOSIS — O99212 Obesity complicating pregnancy, second trimester: Secondary | ICD-10-CM | POA: Diagnosis not present

## 2022-12-14 DIAGNOSIS — O0990 Supervision of high risk pregnancy, unspecified, unspecified trimester: Secondary | ICD-10-CM

## 2022-12-14 DIAGNOSIS — O99213 Obesity complicating pregnancy, third trimester: Secondary | ICD-10-CM

## 2022-12-14 DIAGNOSIS — Z3482 Encounter for supervision of other normal pregnancy, second trimester: Secondary | ICD-10-CM

## 2022-12-14 MED ORDER — ASPIRIN 81 MG PO TBEC: 81.0000 mg | DELAYED_RELEASE_TABLET | Freq: Every day | ORAL | 2 refills | Status: DC

## 2022-12-14 MED ORDER — VITAFOL ULTRA 29-0.6-0.4-200 MG PO CAPS: 1.0000 | ORAL_CAPSULE | Freq: Every day | ORAL | 11 refills | Status: DC

## 2022-12-14 NOTE — Progress Notes (Signed)
Subjective:   Madeline Cooper is a 32 y.o. G3P2002 at [redacted]w[redacted]d by 8.1 weeks early ultrasound being seen today for her first obstetrical visit.  Patient states this was an planned pregnancy.  Patient reports she had a Paragard IUD postplacentally, but removed it herself about 2 months after insertion.   Gynecological/Obstetrical History: Patient reports denies history of gynecological surgeries.  Patient denies history of abnormal pap smears.   Pregnancy history fully reviewed. Patient does intend to breast feed. Patient obstetrical history is significant for SVD, h/o SD and obesity.   Sexual Activity and Vaginal Concerns: Patient is currently sexually active and denies pain or discomfort during intercourse.  She also denies vaginal discharge, bleeding, irritation, or odor. Patient also denies pain or difficulty with urination.    Medical History/ROS: Patient denies medical history significant for cardiovascular, respiratory, gastrointestinal, or hematological disorders. Patient also denies history of MH disorders including anxiety and/or depression.  Patient reports nausea and vomiting. She states symptoms are starting to improve, but now having fatigue. She reports she did not require medication and just "powered through."  Patient denies constipation/diarrhea. She reports headaches that resolves with water intake.    Social History: Patient denies history or current usage of tobacco or drugs.  She reports some social alcohol usage prior to pregnancy, but none currently. Patient reports the FOB is husband-Jacob who is involved and supportive, but at work today.  Patient reports that she lives with Gerilyn Pilgrim and children and stepson and endorses safety at home.  Patient denies DV/A. Patient is not currently employed.  HISTORY: OB History  Gravida Para Term Preterm AB Living  3 2 2  0 0 2  SAB IAB Ectopic Multiple Live Births  0 0 0 0 2    # Outcome Date GA Lbr Len/2nd Weight Sex Type Anes  PTL Lv  3 Current           2 Term 10/11/21 [redacted]w[redacted]d 01:24 / 00:08 7 lb 9 oz (3.43 kg) F Vag-Spont EPI  LIV     Name: Michelle,GIRL Merly     Apgar1: 9  Apgar5: 9  1 Term 09/10/20 [redacted]w[redacted]d 09:56 / 01:13 8 lb 4.5 oz (3.756 kg) M Vag-Spont EPI  LIV     Name: Lurline Del [facility]     Apgar1: 7  Apgar5: 9    Last pap smear was done March 2022 and was normal  Past Medical History:  Diagnosis Date   Medical history non-contributory    Past Surgical History:  Procedure Laterality Date   COSMETIC SURGERY  08/2018   Eyelids   TONSILECTOMY, ADENOIDECTOMY, BILATERAL MYRINGOTOMY AND TUBES  1999   TONSILLECTOMY     WISDOM TOOTH EXTRACTION  2009   Family History  Problem Relation Age of Onset   Diabetes Mother    Hypertension Maternal Grandfather    Dementia Maternal Grandfather    Social History   Tobacco Use   Smoking status: Never   Smokeless tobacco: Never  Vaping Use   Vaping status: Never Used  Substance Use Topics   Alcohol use: Not Currently    Comment: Last drink January 2023   Drug use: Never   No Known Allergies No current outpatient medications on file prior to visit.   No current facility-administered medications on file prior to visit.    Review of Systems Pertinent items noted in HPI and remainder of comprehensive ROS otherwise negative.  Exam   Vitals:   12/14/22 0944  BP: 115/69  Pulse: 91  Weight: 208 lb 6.4 oz (94.5 kg)   Fetal Heart Rate (bpm): 141  Physical Exam Constitutional:      General: She is not in acute distress.    Appearance: Normal appearance. She is obese.  HENT:     Head: Normocephalic and atraumatic.  Eyes:     Conjunctiva/sclera: Conjunctivae normal.  Cardiovascular:     Rate and Rhythm: Normal rate.     Heart sounds: Normal heart sounds.  Pulmonary:     Effort: Pulmonary effort is normal. No respiratory distress.     Breath sounds: Normal breath sounds.  Abdominal:     Palpations: Abdomen is soft.     Tenderness: There is no  abdominal tenderness.  Musculoskeletal:        General: Normal range of motion.     Cervical back: Normal range of motion.  Neurological:     Mental Status: She is alert and oriented to person, place, and time.  Skin:    General: Skin is warm and dry.  Psychiatric:        Mood and Affect: Mood normal.        Behavior: Behavior normal.  Vitals reviewed.     Assessment:   32 y.o. year old G3P2002 Patient Active Problem List   Diagnosis Date Noted   Supervision of high risk pregnancy, antepartum 10/26/2022   IUP (intrauterine pregnancy), incidental 10/11/2021   Encounter for IUD insertion 10/11/2021   History of shoulder dystocia in prior pregnancy, currently pregnant in third trimester 10/04/2021   Short interval between pregnancies affecting pregnancy in third trimester, antepartum 10/04/2021   BMI 36.0-36.9,adult 10/04/2021   Obesity in pregnancy, antepartum, third trimester 10/04/2021   Supervision of high risk pregnancy, antepartum, third trimester 03/15/2021     Plan:  1. Supervision of high risk pregnancy, antepartum -Congratulations given and patient welcomed to practice. -Discussed potential usage of Babyscripts and virtual visits as additional source of managing and completing PN visits.   -Reviewed prenatal visit schedule and platforms used for virtual visits.  -Anticipatory guidance for interventions during prenatal visits including labs, ultrasounds, and testing; Initial labs drawn. -Genetic Screening discussed, First trimester screen, Quad screen, and NIPS: ordered. -Ultrasound discussed; fetal anatomic survey:  scheduled . -Discussed estimated due date of June 06, 2023. -Encouraged to seek out care at office or emergency room for urgent and/or emergent concerns. -Educated on the nature of [facility] Practice with multiple MDs and other Advanced Practice Providers was explained to patient; also emphasized that residents, students are part  of our team. Informed of her right to refuse care as she deems appropriate.  -Encouraged to complete and utilize MyChart Registration for her ability to review results, send requests, and have questions addressed.  -No questions or concerns.    2. [redacted] weeks gestation of pregnancy -Doing well. -Reviewed anticipated pregnancy discomforts associated with short interval pregnancy including pelvic pain/pressure, cramping, and fatigue.  -Briefly reviewed WB and information placed in AVS.  Patient cautioned that WB does not remove pain from birthing process, but is a coping measure.   3. Obesity in pregnancy, antepartum, third trimester -Reviewed PreEclampsia risk factors including BMI 30.  -Reviewed research and findings supporting initiation of bASA to reduce risk of developing condition later in pregnancy.   -Patient verbalizes understanding and agrees to plan. -Script sent today.       Problem list reviewed and updated. Routine obstetric precautions reviewed.  Orders Placed This Encounter  Procedures  CBC/D/Plt+RPR+Rh+ABO+RubIgG...   HgB A1c   PANORAMA PRENATAL TEST    ==========Department Information========== ID: [id number] Department:[facility] AT [facility] [address] [place] [address] Dept: [phone] Dept Fax: [phone]     Method/type of collection::   [employer] to follow up with patient for sample collection (mobile phleb)    Expected due date (MM/DD/YYYY)::   06/06/2023    Is this a twin pregnancy? (viable, no vanished twin):   Not Twin Pregnancy, Mason Jim    Is this a surrogate or egg donor pregnancy?:   No    I want fetal sex included in the report::   Yes    Maternal Weight (lbs)::   208    Which Microdeletion Panel should be ordered?:   22q11.2 Deletion    What type of billing?:   Automatic Data    By placing this electronic order I confirm the testing ordered herein is  medically necessary and this patient has been informed of the details of the genetic test(s) ordered, including the risks, benefits, and alternatives, and has consented to testing.:   Yes    Select an order diagnosis: For additional options refer to [email or web address]:   Encounter for supervision of other normal pregnancy in second trimester [[id number]]    No follow-ups on file.     Cherre Robins, CNM 12/14/2022 10:16 AM

## 2022-12-14 NOTE — Progress Notes (Signed)
Pt presents for NOB visit. No concerns.

## 2022-12-14 NOTE — Patient Instructions (Signed)

## 2022-12-15 LAB — CBC/D/PLT+RPR+RH+ABO+RUBIGG...
Antibody Screen: NEGATIVE
Basophils Absolute: 0.1 10*3/uL (ref 0.0–0.2)
Basos: 1 %
EOS (ABSOLUTE): 0.2 10*3/uL (ref 0.0–0.4)
Eos: 2 %
HCV Ab: NONREACTIVE
HIV Screen 4th Generation wRfx: NONREACTIVE
Hematocrit: 36.2 % (ref 34.0–46.6)
Hemoglobin: 12.1 g/dL (ref 11.1–15.9)
Hepatitis B Surface Ag: NEGATIVE
Immature Grans (Abs): 0 10*3/uL (ref 0.0–0.1)
Immature Granulocytes: 0 %
Lymphocytes Absolute: 1.8 10*3/uL (ref 0.7–3.1)
Lymphs: 19 %
MCH: 30.3 pg (ref 26.6–33.0)
MCHC: 33.4 g/dL (ref 31.5–35.7)
MCV: 91 fL (ref 79–97)
Monocytes Absolute: 0.6 10*3/uL (ref 0.1–0.9)
Monocytes: 6 %
Neutrophils Absolute: 7.1 10*3/uL — ABNORMAL HIGH (ref 1.4–7.0)
Neutrophils: 72 %
Platelets: 293 10*3/uL (ref 150–450)
RBC: 3.99 x10E6/uL (ref 3.77–5.28)
RDW: 12.6 % (ref 11.7–15.4)
RPR Ser Ql: NONREACTIVE
Rh Factor: POSITIVE
Rubella Antibodies, IGG: 1.59 {index} (ref >0.99)
WBC: 9.8 10*3/uL (ref 3.4–10.8)

## 2022-12-15 LAB — HCV INTERPRETATION

## 2022-12-15 LAB — HEMOGLOBIN A1C
Est. average glucose Bld gHb Est-mCnc: 105 mg/dL
Hgb A1c MFr Bld: 5.3 % (ref 4.8–5.6)

## 2022-12-24 LAB — PANORAMA PRENATAL TEST FULL PANEL:PANORAMA TEST PLUS 5 ADDITIONAL MICRODELETIONS

## 2023-01-02 NOTE — L&D Delivery Note (Signed)
 OB/GYN Faculty Practice Delivery Note  Madeline Cooper is a 33 y.o. [id number] s/p SVD at [redacted]w[redacted]d. She was admitted for IOL due to A1GDM.   ROM: 10h 72m with clear fluid GBS Status: Negative/-- (05/09 1007) Maximum Maternal Temperature: 98.31F   Labor Progress: Initial SVE: 1/thick/-3. She then progressed to complete.   Delivery Date/Time: 05/17/23 1014 Delivery: Called to room and patient was complete and pushing. Head delivered ROA. No nuchal cord present. Shoulder and body delivered in usual fashion. Infant with spontaneous cry, placed on mother's abdomen, dried and stimulated. Cord clamped x 2 after 1-minute delay, and cut by father of baby, Derwood Flor. Cord blood drawn. Steady trickle noted soon after delivery. Placenta delivered spontaneously with gentle cord traction. Fundus firm with massage, Pitocin , and Methergine. Bladder emptied with in-and-out catheterization with 50cc clear urine. Pt continued to have steady trickle so a lower uterine sweep was performed with a small piece of membranes removed. Following this, bleeding improved significantly. Labia, perineum, vagina, and cervix inspected inspected with a small vaginal abrasion noted which required repair to achieve hemostasis. Baby Weight: pending  Placenta: Sent to L&D Complications: None Lacerations: Small right sided vaginal abrasion - repaired EBL: 131 mL Analgesia: Epidural   Infant:  APGAR (1 MIN): 8  APGAR (5 MINS): 9   Melanie Spires, MD OB Fellow, Faculty Practice [facility], Center for [facility]

## 2023-01-07 DIAGNOSIS — O280 Abnormal hematological finding on antenatal screening of mother: Secondary | ICD-10-CM | POA: Insufficient documentation

## 2023-01-10 ENCOUNTER — Ambulatory Visit: Payer: Medicaid Other | Attending: Obstetrics and Gynecology

## 2023-01-10 ENCOUNTER — Other Ambulatory Visit: Payer: Self-pay | Admitting: *Deleted

## 2023-01-10 ENCOUNTER — Ambulatory Visit: Payer: Medicaid Other | Admitting: *Deleted

## 2023-01-10 ENCOUNTER — Other Ambulatory Visit: Payer: Self-pay

## 2023-01-10 ENCOUNTER — Encounter: Payer: Self-pay | Admitting: *Deleted

## 2023-01-10 VITALS — BP 115/70 | HR 85

## 2023-01-10 DIAGNOSIS — O280 Abnormal hematological finding on antenatal screening of mother: Secondary | ICD-10-CM

## 2023-01-10 DIAGNOSIS — O3662X0 Maternal care for excessive fetal growth, second trimester, not applicable or unspecified: Secondary | ICD-10-CM | POA: Diagnosis not present

## 2023-01-10 DIAGNOSIS — E669 Obesity, unspecified: Secondary | ICD-10-CM | POA: Diagnosis not present

## 2023-01-10 DIAGNOSIS — O09292 Supervision of pregnancy with other poor reproductive or obstetric history, second trimester: Secondary | ICD-10-CM

## 2023-01-10 DIAGNOSIS — N83201 Unspecified ovarian cyst, right side: Secondary | ICD-10-CM

## 2023-01-10 DIAGNOSIS — O99212 Obesity complicating pregnancy, second trimester: Secondary | ICD-10-CM

## 2023-01-10 DIAGNOSIS — Z3A19 19 weeks gestation of pregnancy: Secondary | ICD-10-CM

## 2023-01-10 DIAGNOSIS — Z362 Encounter for other antenatal screening follow-up: Secondary | ICD-10-CM

## 2023-01-10 DIAGNOSIS — O0990 Supervision of high risk pregnancy, unspecified, unspecified trimester: Secondary | ICD-10-CM

## 2023-01-10 DIAGNOSIS — O285 Abnormal chromosomal and genetic finding on antenatal screening of mother: Secondary | ICD-10-CM

## 2023-01-10 DIAGNOSIS — O09892 Supervision of other high risk pregnancies, second trimester: Secondary | ICD-10-CM

## 2023-01-10 DIAGNOSIS — O3482 Maternal care for other abnormalities of pelvic organs, second trimester: Secondary | ICD-10-CM

## 2023-01-11 ENCOUNTER — Encounter: Payer: Self-pay | Admitting: Obstetrics & Gynecology

## 2023-01-11 ENCOUNTER — Telehealth (INDEPENDENT_AMBULATORY_CARE_PROVIDER_SITE_OTHER): Payer: Medicaid Other | Admitting: Obstetrics & Gynecology

## 2023-01-11 DIAGNOSIS — O0990 Supervision of high risk pregnancy, unspecified, unspecified trimester: Secondary | ICD-10-CM

## 2023-01-11 DIAGNOSIS — Z3A19 19 weeks gestation of pregnancy: Secondary | ICD-10-CM

## 2023-01-11 DIAGNOSIS — O0992 Supervision of high risk pregnancy, unspecified, second trimester: Secondary | ICD-10-CM

## 2023-01-11 NOTE — Progress Notes (Signed)
   PRENATAL VISIT NOTE TELEHEALTH VIRTUAL GYNECOLOGY VISIT ENCOUNTER NOTE  I connected with Madeline Cooper on 04/23/18 at  1:35 PM EDT by telephone at home and verified that I am speaking with the correct person using two identifiers.   I discussed the limitations, risks, security and privacy concerns of performing an evaluation and management service by telephone and the availability of in person appointments. I also discussed with the patient that there may be a patient responsible charge related to this service. The patient expressed understanding and agreed to proceed.    Subjective:  Madeline Cooper is a 33 y.o. G3P2002 at [redacted]w[redacted]d being seen today for ongoing prenatal care.  She is currently monitored for the following issues for this high-risk pregnancy and has History of shoulder dystocia in prior pregnancy, currently pregnant; Short interval between pregnancies affecting pregnancy in third trimester, antepartum; BMI 36.0-36.9,adult; Obesity in pregnancy, antepartum, third trimester; Supervision of high risk pregnancy, antepartum; and No results on Panorama on their problem list.  Patient reports no complaints.  Contractions: Not present. Vag. Bleeding: None.  Movement: Present. Denies leaking of fluid.   The following portions of the patient's history were reviewed and updated as appropriate: allergies, current medications, past family history, past medical history, past social history, past surgical history and problem list.   Objective:  There were no vitals filed for this visit.  Fetal Status:     Movement: Present     She sounds normal on the phone. She has not weighed herself recently, recently moved and doesn't know where her scale is.  Assessment and Plan:  Pregnancy: G3P2002 at [redacted]w[redacted]d There are no diagnoses linked to this encounter. Preterm labor symptoms and general obstetric precautions including but not limited to vaginal bleeding, contractions, leaking of fluid and fetal  movement were reviewed in detail with the patient. Please refer to After Visit Summary for other counseling recommendations.   Follow up in about 4 weeks She will get a re draw on her NIPS due to inadequate cells with last test. She may get flu vaccine at next visit prn  Future Appointments  Date Time Provider Department Center  02/14/2023  2:30 PM [facility] US5 [facility]    Madeline JAYSON Birkenhead, MD

## 2023-01-11 NOTE — Progress Notes (Signed)
S/w pt for virtual ROB visit. No concerns.

## 2023-02-14 ENCOUNTER — Ambulatory Visit: Payer: Medicaid Other

## 2023-02-23 NOTE — Progress Notes (Unsigned)
   PRENATAL VISIT NOTE  Subjective:  Madeline Cooper is a 33 y.o. G3P2002 at [redacted]w[redacted]d being seen today for ongoing prenatal care.  She is currently monitored for the following issues for this high-risk pregnancy and has History of shoulder dystocia in prior pregnancy, currently pregnant; Short interval between pregnancies affecting pregnancy in third trimester, antepartum; BMI 36.0-36.9,adult; Obesity in pregnancy, antepartum, third trimester; Supervision of high risk pregnancy, antepartum; and "No results" on Panorama on their problem list.  Patient reports no concerns.  Denies leaking of fluid, vaginal bleeding, contractions.  Endorses fetal movement.  The following portions of the patient's history were reviewed and updated as appropriate: allergies, current medications, past family history, past medical history, past social history, past surgical history and problem list.   Objective:  There were no vitals filed for this visit.  Fetal Status:           General:  Alert, oriented and cooperative. Patient is in no acute distress.  Skin: Skin is warm and dry. No rash noted.   Cardiovascular: Normal heart rate noted  Respiratory: Normal respiratory effort, no problems with respiration noted  Abdomen: Soft, gravid, appropriate for gestational age.        Pelvic: Cervical exam deferred        Extremities: Normal range of motion.     Mental Status: Normal mood and affect. Normal behavior. Normal judgment and thought content.   Assessment and Plan:  Pregnancy: G3P2002 at [redacted]w[redacted]d 1. Supervision of high risk pregnancy, antepartum (Primary) Patient doing well, feeling regular fetal movement BP, FHR, FH appropriate No results on previous panorama. Repeat today. ***  2. [redacted] weeks gestation of pregnancy Anticipatory guidance about next visits/weeks of pregnancy given, including fasting for next visit.  3. BMI 36.0-36.9,adult A1c 5.3  4. History of shoulder dystocia in prior pregnancy, currently  pregnant   Preterm labor symptoms and general obstetric precautions including but not limited to vaginal bleeding, contractions, leaking of fluid and fetal movement were reviewed in detail with the patient.  Please refer to After Visit Summary for other counseling recommendations.   No follow-ups on file.  Future Appointments  Date Time Provider Department Center  03/01/2023 11:10 AM Ralene Muskrat, [place] [facility] None  03/08/2023  1:30 PM [facility] US6 [facility]    Ralene Muskrat, PA-C

## 2023-03-01 ENCOUNTER — Ambulatory Visit (INDEPENDENT_AMBULATORY_CARE_PROVIDER_SITE_OTHER): Payer: Medicaid Other | Admitting: Physician Assistant

## 2023-03-01 ENCOUNTER — Encounter: Payer: Self-pay | Admitting: Physician Assistant

## 2023-03-01 VITALS — BP 110/70 | HR 92 | Wt 214.0 lb

## 2023-03-01 DIAGNOSIS — O09299 Supervision of pregnancy with other poor reproductive or obstetric history, unspecified trimester: Secondary | ICD-10-CM

## 2023-03-01 DIAGNOSIS — Z6836 Body mass index (BMI) 36.0-36.9, adult: Secondary | ICD-10-CM | POA: Diagnosis not present

## 2023-03-01 DIAGNOSIS — Z3A26 26 weeks gestation of pregnancy: Secondary | ICD-10-CM

## 2023-03-01 DIAGNOSIS — O0990 Supervision of high risk pregnancy, unspecified, unspecified trimester: Secondary | ICD-10-CM | POA: Diagnosis not present

## 2023-03-01 NOTE — Patient Instructions (Signed)
 Please return for your next visit fasting since midnight.

## 2023-03-08 ENCOUNTER — Other Ambulatory Visit: Payer: Self-pay | Admitting: *Deleted

## 2023-03-08 ENCOUNTER — Encounter: Payer: Self-pay | Admitting: Physician Assistant

## 2023-03-08 ENCOUNTER — Ambulatory Visit: Payer: No Typology Code available for payment source | Attending: Obstetrics and Gynecology

## 2023-03-08 DIAGNOSIS — Z362 Encounter for other antenatal screening follow-up: Secondary | ICD-10-CM | POA: Diagnosis present

## 2023-03-08 DIAGNOSIS — O3662X0 Maternal care for excessive fetal growth, second trimester, not applicable or unspecified: Secondary | ICD-10-CM

## 2023-03-08 DIAGNOSIS — O285 Abnormal chromosomal and genetic finding on antenatal screening of mother: Secondary | ICD-10-CM

## 2023-03-08 DIAGNOSIS — Z3A27 27 weeks gestation of pregnancy: Secondary | ICD-10-CM

## 2023-03-08 DIAGNOSIS — O09292 Supervision of pregnancy with other poor reproductive or obstetric history, second trimester: Secondary | ICD-10-CM

## 2023-03-08 DIAGNOSIS — E669 Obesity, unspecified: Secondary | ICD-10-CM

## 2023-03-08 DIAGNOSIS — O99212 Obesity complicating pregnancy, second trimester: Secondary | ICD-10-CM | POA: Diagnosis not present

## 2023-03-08 DIAGNOSIS — N83209 Unspecified ovarian cyst, unspecified side: Secondary | ICD-10-CM

## 2023-03-08 DIAGNOSIS — O09892 Supervision of other high risk pregnancies, second trimester: Secondary | ICD-10-CM | POA: Diagnosis present

## 2023-03-08 DIAGNOSIS — O3660X0 Maternal care for excessive fetal growth, unspecified trimester, not applicable or unspecified: Secondary | ICD-10-CM

## 2023-03-08 DIAGNOSIS — O3482 Maternal care for other abnormalities of pelvic organs, second trimester: Secondary | ICD-10-CM

## 2023-03-08 LAB — PANORAMA PRENATAL TEST FULL PANEL:PANORAMA TEST PLUS 5 ADDITIONAL MICRODELETIONS: FETAL FRACTION: 20.4

## 2023-03-15 ENCOUNTER — Other Ambulatory Visit: Payer: Medicaid Other

## 2023-03-15 ENCOUNTER — Ambulatory Visit (INDEPENDENT_AMBULATORY_CARE_PROVIDER_SITE_OTHER): Payer: Medicaid Other | Admitting: Obstetrics & Gynecology

## 2023-03-15 VITALS — BP 97/65 | HR 81 | Wt 214.0 lb

## 2023-03-15 DIAGNOSIS — Z3A28 28 weeks gestation of pregnancy: Secondary | ICD-10-CM | POA: Diagnosis not present

## 2023-03-15 DIAGNOSIS — Z3A23 23 weeks gestation of pregnancy: Secondary | ICD-10-CM

## 2023-03-15 DIAGNOSIS — Z6836 Body mass index (BMI) 36.0-36.9, adult: Secondary | ICD-10-CM | POA: Diagnosis not present

## 2023-03-15 DIAGNOSIS — O24419 Gestational diabetes mellitus in pregnancy, unspecified control: Secondary | ICD-10-CM

## 2023-03-15 DIAGNOSIS — O0990 Supervision of high risk pregnancy, unspecified, unspecified trimester: Secondary | ICD-10-CM | POA: Diagnosis not present

## 2023-03-15 NOTE — Progress Notes (Signed)
   PRENATAL VISIT NOTE  Subjective:  Madeline Cooper is a 33 y.o. G3P2002 at [redacted]w[redacted]d being seen today for ongoing prenatal care.  She is currently monitored for the following issues for this high-risk pregnancy and has History of shoulder dystocia in prior pregnancy, currently pregnant; Short interval between pregnancies affecting pregnancy in third trimester, antepartum; BMI 36.0-36.9,adult; Obesity in pregnancy, antepartum, third trimester; Supervision of high risk pregnancy, antepartum; and "No results" on Panorama on their problem list.  Patient reports no complaints.  Contractions: Not present. Vag. Bleeding: None.  Movement: Present. Denies leaking of fluid.   The following portions of the patient's history were reviewed and updated as appropriate: allergies, current medications, past family history, past medical history, past social history, past surgical history and problem list.   Objective:   Vitals:   03/15/23 0949  BP: 97/65  Pulse: 81  Weight: 214 lb (97.1 kg)    Fetal Status: Fetal Heart Rate (bpm): 150   Movement: Present     General:  Alert, oriented and cooperative. Patient is in no acute distress.  Skin: Skin is warm and dry. No rash noted.   Cardiovascular: Normal heart rate noted  Respiratory: Normal respiratory effort, no problems with respiration noted  Abdomen: Soft, gravid, appropriate for gestational age.  Pain/Pressure: Absent     Pelvic: Cervical exam deferred        Extremities: Normal range of motion.     Mental Status: Normal mood and affect. Normal behavior. Normal judgment and thought content.   Assessment and Plan:  Pregnancy: G3P2002 at [redacted]w[redacted]d 1. Supervision of high risk pregnancy, antepartum (Primary) 28 week labs - Glucose Tolerance, 2 Hours w/1 Hour - CBC - HIV Antibody (routine testing w rflx) - RPR  2. [redacted] weeks gestation of pregnancy  - Glucose Tolerance, 2 Hours w/1 Hour - CBC - HIV Antibody (routine testing w rflx) - RPR  3. BMI  36.0-36.9,adult   Preterm labor symptoms and general obstetric precautions including but not limited to vaginal bleeding, contractions, leaking of fluid and fetal movement were reviewed in detail with the patient. Please refer to After Visit Summary for other counseling recommendations.   Return in about 2 weeks (around 03/29/2023).  Future Appointments  Date Time Provider Department Center  03/15/2023 10:15 AM Adam Phenix, [place] [facility] None  05/03/2023  2:30 PM [facility] US5 [facility]    Scheryl Darter, MD

## 2023-03-16 LAB — CBC
Hematocrit: 35 % (ref 34.0–46.6)
Hemoglobin: 11.6 g/dL (ref 11.1–15.9)
MCH: 30.4 pg (ref 26.6–33.0)
MCHC: 33.1 g/dL (ref 31.5–35.7)
MCV: 92 fL (ref 79–97)
Platelets: 272 10*3/uL (ref 150–450)
RBC: 3.82 x10E6/uL (ref 3.77–5.28)
RDW: 12.9 % (ref 11.7–15.4)
WBC: 10.8 10*3/uL (ref 3.4–10.8)

## 2023-03-16 LAB — GLUCOSE TOLERANCE, 2 HOURS W/ 1HR
Glucose, 1 hour: 180 mg/dL — ABNORMAL HIGH (ref 70–179)
Glucose, 2 hour: 108 mg/dL (ref 70–152)
Glucose, Fasting: 87 mg/dL (ref 70–91)

## 2023-03-16 LAB — RPR: RPR Ser Ql: NONREACTIVE

## 2023-03-16 LAB — HIV ANTIBODY (ROUTINE TESTING W REFLEX): HIV Screen 4th Generation wRfx: NONREACTIVE

## 2023-03-26 ENCOUNTER — Encounter: Payer: Self-pay | Admitting: Obstetrics & Gynecology

## 2023-03-26 DIAGNOSIS — O24419 Gestational diabetes mellitus in pregnancy, unspecified control: Secondary | ICD-10-CM | POA: Insufficient documentation

## 2023-03-26 NOTE — Addendum Note (Signed)
 Addended by: Adam Phenix on: 03/26/2023 09:27 AM   Modules accepted: Orders

## 2023-03-29 ENCOUNTER — Encounter: Payer: Self-pay | Admitting: Nurse Practitioner

## 2023-03-29 ENCOUNTER — Ambulatory Visit: Admitting: Nurse Practitioner

## 2023-03-29 VITALS — BP 115/74 | HR 89 | Wt 214.0 lb

## 2023-03-29 DIAGNOSIS — O24419 Gestational diabetes mellitus in pregnancy, unspecified control: Secondary | ICD-10-CM

## 2023-03-29 DIAGNOSIS — O0990 Supervision of high risk pregnancy, unspecified, unspecified trimester: Secondary | ICD-10-CM | POA: Diagnosis not present

## 2023-03-29 DIAGNOSIS — Z3A30 30 weeks gestation of pregnancy: Secondary | ICD-10-CM | POA: Diagnosis not present

## 2023-03-29 MED ORDER — ACCU-CHEK SOFTCLIX LANCETS MISC: 12 refills | Status: DC

## 2023-03-29 MED ORDER — ACCU-CHEK GUIDE W/DEVICE KIT: 1.0000 | PACK | Freq: Four times a day (QID) | 0 refills | Status: DC

## 2023-03-29 MED ORDER — GLUCOSE BLOOD VI STRP: ORAL_STRIP | 12 refills | Status: DC

## 2023-03-29 NOTE — Progress Notes (Signed)
   PRENATAL VISIT NOTE  Subjective:  Madeline Cooper is a 33 y.o. G3P2002 at [redacted]w[redacted]d being seen today for ongoing prenatal care.  She is currently monitored for the following issues for this high-risk pregnancy and has History of shoulder dystocia in prior pregnancy, currently pregnant; Short interval between pregnancies affecting pregnancy in third trimester, antepartum; BMI 36.0-36.9,adult; Obesity in pregnancy, antepartum, third trimester; Supervision of high risk pregnancy, antepartum; "No results" on Panorama; and Gestational diabetes mellitus (GDM) in third trimester on their problem list.  Patient reports no complaints.  Contractions: Not present. Vag. Bleeding: None.  Movement: Present. Denies leaking of fluid.   The following portions of the patient's history were reviewed and updated as appropriate: allergies, current medications, past family history, past medical history, past social history, past surgical history and problem list.   Objective:   Vitals:   03/29/23 1003  BP: 115/74  Pulse: 89  Weight: 214 lb (97.1 kg)    Fetal Status: Fetal Heart Rate (bpm): 138 (Simultaneous filing. User may not have seen previous data.) Fundal Height: 31 cm Movement: Present     General:  Alert, oriented and cooperative. Patient is in no acute distress.  Skin: Skin is warm and dry. No rash noted.   Cardiovascular: Normal heart rate noted  Respiratory: Normal respiratory effort, no problems with respiration noted  Abdomen: Soft, gravid, appropriate for gestational age.  Pain/Pressure: Absent     Pelvic: Cervical exam deferred        Extremities: Normal range of motion.  Edema: None  Mental Status: Normal mood and affect. Normal behavior. Normal judgment and thought content.   Assessment and Plan:  Pregnancy: G3P2002 at [redacted]w[redacted]d  1. Supervision of high risk pregnancy, antepartum (Primary) - doing well - FH, FHR appropriate  2. [redacted] weeks gestation of pregnancy   3. Gestational diabetes  mellitus (GDM) in third trimester, gestational diabetes method of control unspecified  - Blood Glucose Monitoring Suppl (ACCU-CHEK GUIDE) w/Device KIT; 1 Device by Does not apply route 4 (four) times daily. Please check blood sugar 4 times daily. Once in the morning when you first wake up before eating (fasting), and then 2 hours after each meal breakfast, lunch, and dinner.  Dispense: 1 kit; Refill: 0 - Accu-Chek Softclix Lancets lancets; Use as instructed  Dispense: 100 each; Refill: 12 - glucose blood test strip; Please check blood sugar 4 times daily. Once in the morning when you first wake up before eating (fasting), and then 2 hours after each meal breakfast, lunch, and dinner.  Dispense: 100 each; Refill: 12     Preterm labor symptoms and general obstetric precautions including but not limited to vaginal bleeding, contractions, leaking of fluid and fetal movement were reviewed in detail with the patient. Please refer to After Visit Summary for other counseling recommendations.   Return in about 2 weeks (around 04/12/2023) for [facility].  Future Appointments  Date Time Provider Department Center  04/03/2023  9:00 AM [facility] GDM CLASS [facility] NDM  04/15/2023 11:15 AM Anyanwu, Jethro Bastos, MD [facility] None  05/03/2023  2:30 PM [facility] US5 [facility]    Colman Cater, NP  Pt presents for ROB visit. Diabetes ed class scheduled for 04-03-23. Need glucose supplies sent to pharmacy

## 2023-04-01 ENCOUNTER — Other Ambulatory Visit: Payer: Self-pay | Admitting: *Deleted

## 2023-04-01 DIAGNOSIS — O24419 Gestational diabetes mellitus in pregnancy, unspecified control: Secondary | ICD-10-CM

## 2023-04-01 MED ORDER — ACCU-CHEK SOFTCLIX LANCETS MISC: 12 refills | Status: DC

## 2023-04-01 MED ORDER — ACCU-CHEK GUIDE W/DEVICE KIT: 1.0000 | PACK | Freq: Four times a day (QID) | 0 refills | Status: DC

## 2023-04-01 NOTE — Progress Notes (Signed)
 TC from pt. She is having difficulty getting CBG meter from original pharmacy/ [facility]. I spoke with pharmacy staff and they can not confirm the meter will arrive today. The pt has an upcoming diabetes education appt. Pt has picked up test strips. RX lancets and meter sent to [facility] instead.

## 2023-04-03 ENCOUNTER — Encounter: Attending: Obstetrics & Gynecology | Admitting: Dietician

## 2023-04-03 DIAGNOSIS — O24419 Gestational diabetes mellitus in pregnancy, unspecified control: Secondary | ICD-10-CM | POA: Diagnosis present

## 2023-04-03 NOTE — Progress Notes (Signed)
 Patient was seen on 04/03/2023 for Gestational Diabetes self-management class at the [facility]. The following learning objectives were met by the patient during this course:   Pt arrived to class late.   States the definition of Gestational Diabetes States why dietary management is important in controlling blood glucose Describes the effects each nutrient has on blood glucose levels Demonstrates ability to create a balanced meal plan Demonstrates carbohydrate counting  States when to check blood glucose levels Demonstrates proper blood glucose monitoring techniques States the effect of stress and exercise on blood glucose levels States the importance of limiting caffeine and abstaining from alcohol and smoking  Patient presented with meter received prior to visit. Patient is instructed to begin testing pre breakfast and 2 hours after each meal. Blood glucose today in class 84 mg/dL, reported as fasting per Pt  Patient instructed to monitor glucose levels: QID FBS: 60 - <90 1 hour: <140 2 hour: <120  *Patient received handouts: Nutrition Diabetes and Pregnancy Carbohydrate Counting List Blood glucose log Snack ideas for diabetes during pregnancy  Patient will be seen for follow-up as needed.

## 2023-04-15 ENCOUNTER — Ambulatory Visit: Admitting: Obstetrics & Gynecology

## 2023-04-15 VITALS — BP 101/67 | HR 92 | Wt 215.0 lb

## 2023-04-15 DIAGNOSIS — O24419 Gestational diabetes mellitus in pregnancy, unspecified control: Secondary | ICD-10-CM | POA: Diagnosis not present

## 2023-04-15 DIAGNOSIS — O2243 Hemorrhoids in pregnancy, third trimester: Secondary | ICD-10-CM

## 2023-04-15 DIAGNOSIS — O3663X0 Maternal care for excessive fetal growth, third trimester, not applicable or unspecified: Secondary | ICD-10-CM | POA: Insufficient documentation

## 2023-04-15 DIAGNOSIS — Z3A32 32 weeks gestation of pregnancy: Secondary | ICD-10-CM

## 2023-04-15 DIAGNOSIS — Z23 Encounter for immunization: Secondary | ICD-10-CM

## 2023-04-15 DIAGNOSIS — O0990 Supervision of high risk pregnancy, unspecified, unspecified trimester: Secondary | ICD-10-CM

## 2023-04-15 DIAGNOSIS — O09299 Supervision of pregnancy with other poor reproductive or obstetric history, unspecified trimester: Secondary | ICD-10-CM

## 2023-04-15 NOTE — Progress Notes (Signed)
 Pt has glucose reading with her today for review. Pt has u/s appt in May.

## 2023-04-15 NOTE — Patient Instructions (Signed)

## 2023-04-15 NOTE — Progress Notes (Addendum)
 PRENATAL VISIT NOTE  Subjective:  Madeline Cooper is a 33 y.o. G3P2002 at [redacted]w[redacted]d being seen today for ongoing prenatal care.  She is currently monitored for the following issues for this high-risk pregnancy and has History of shoulder dystocia in prior pregnancy, currently pregnant; Short interval between pregnancies affecting pregnancy in third trimester, antepartum; BMI 36.0-36.9,adult; Obesity in pregnancy, antepartum, third trimester; Supervision of high risk pregnancy, antepartum; "No results" on Panorama; Gestational diabetes mellitus (GDM) in third trimester; and LGA (large for gestational age) fetus affecting management of mother, third trimester on their problem list.  Patient reports no bleeding, no contractions, no leaking, and endorsed blood in her stool which she attributes to hemorrhoids, no pain .  Contractions: Not present. Vag. Bleeding: None.  Movement: Present. Denies leaking of fluid.   The following portions of the patient's history were reviewed and updated as appropriate: allergies, current medications, past family history, past medical history, past social history, past surgical history and problem list.   Objective:   Vitals:   04/15/23 1130  BP: 101/67  Pulse: 92  Weight: 215 lb (97.5 kg)    Fetal Status: Fetal Heart Rate (bpm): 150   Movement: Present     General:  Alert, oriented and cooperative. Patient is in no acute distress.  Skin: Skin is warm and dry. No rash noted.   Cardiovascular: Normal heart rate noted  Respiratory: Normal respiratory effort, no problems with respiration noted  Abdomen: Soft, gravid, large for gestational age (42cm).  Pain/Pressure: Absent     Pelvic: Cervical exam deferred        Extremities: Normal range of motion.     Mental Status: Normal mood and affect. Normal behavior. Normal judgment and thought content.   Assessment and Plan:  Pregnancy: G3P2002 at [redacted]w[redacted]d 1. Gestational diabetes mellitus (GDM) in third trimester,  gestational diabetes method of control unspecified Patient is doing well with her diet-controlled GDM. She has only had around 3 abnormal readings with the rest being WNL. She will continue her dietary regimen as recommended by her RD and continue monitoring.  2. LGA (large for gestational age) fetus affecting management of mother, third trimester (Primary) Growth ultrasound scheduled for 05/03/23 with MFM.  3. History of shoulder dystocia in prior pregnancy, currently pregnant Patient was counseled today that IOL may need to be done by 38 weeks or earlier if LGA persists. She was also informed that because of her prior shoulder dystocia and continued risk for dystocia that a water birth would be unlikely given safety concerns.  4. Hemorrhoids during pregnancy in third trimester  Patient endorsed blood in her stool which she attributes to hemorrhoids. She currently has no pain and we will continue to monitor, seeing her back in a few weeks. If symptoms persist, will try Anusol vs other intervention.  5. Need for diphtheria-tetanus-pertussis (Tdap) vaccine - Tdap vaccine greater than or equal to 7yo IM today  6. [redacted] weeks gestation of pregnancy 7. Supervision of high risk pregnancy, antepartum Continue routine prenatal care. Preterm labor symptoms and general obstetric precautions including but not limited to vaginal bleeding, contractions, leaking of fluid and fetal movement were reviewed in detail with the patient. Please refer to After Visit Summary for other counseling recommendations.   Return in about 2 weeks (around 04/29/2023) for OFFICE OB VISIT (MD only).  Future Appointments  Date Time Provider Department Center  05/03/2023  2:30 PM [facility] US5 [facility]    Elwin Mocha Medical Student  Attestation of Attending Supervision of Student:  I confirm that I have verified the information documented in the medical student's note and that I have also personally reperformed the  history, physical exam and all medical decision making activities.  I have verified that all services and findings are accurately documented in this student's note; and I agree with management and plan as outlined in the documentation. I have also made any necessary editorial changes.   Lenoard Rad, MD, FACOG Attending Obstetrician & Gynecologist, [facility] for [employer], [facility]

## 2023-05-01 ENCOUNTER — Inpatient Hospital Stay (HOSPITAL_COMMUNITY)

## 2023-05-01 ENCOUNTER — Inpatient Hospital Stay (HOSPITAL_COMMUNITY)
Admission: AD | Admit: 2023-05-01 | Discharge: 2023-05-01 | Disposition: A | Discharge status: Home / Self Care | Attending: Obstetrics and Gynecology | Admitting: Obstetrics and Gynecology

## 2023-05-01 ENCOUNTER — Other Ambulatory Visit: Payer: Self-pay

## 2023-05-01 ENCOUNTER — Telehealth: Payer: Self-pay

## 2023-05-01 ENCOUNTER — Encounter (HOSPITAL_COMMUNITY): Payer: Self-pay | Admitting: Obstetrics and Gynecology

## 2023-05-01 DIAGNOSIS — O0990 Supervision of high risk pregnancy, unspecified, unspecified trimester: Secondary | ICD-10-CM

## 2023-05-01 DIAGNOSIS — O99213 Obesity complicating pregnancy, third trimester: Secondary | ICD-10-CM | POA: Insufficient documentation

## 2023-05-01 DIAGNOSIS — O09293 Supervision of pregnancy with other poor reproductive or obstetric history, third trimester: Secondary | ICD-10-CM | POA: Insufficient documentation

## 2023-05-01 DIAGNOSIS — O3663X0 Maternal care for excessive fetal growth, third trimester, not applicable or unspecified: Secondary | ICD-10-CM | POA: Diagnosis not present

## 2023-05-01 DIAGNOSIS — Z3A34 34 weeks gestation of pregnancy: Secondary | ICD-10-CM | POA: Insufficient documentation

## 2023-05-01 DIAGNOSIS — O4703 False labor before 37 completed weeks of gestation, third trimester: Secondary | ICD-10-CM

## 2023-05-01 DIAGNOSIS — O285 Abnormal chromosomal and genetic finding on antenatal screening of mother: Secondary | ICD-10-CM | POA: Diagnosis not present

## 2023-05-01 DIAGNOSIS — Z3689 Encounter for other specified antenatal screening: Secondary | ICD-10-CM

## 2023-05-01 DIAGNOSIS — O42913 Preterm premature rupture of membranes, unspecified as to length of time between rupture and onset of labor, third trimester: Secondary | ICD-10-CM | POA: Diagnosis not present

## 2023-05-01 DIAGNOSIS — E669 Obesity, unspecified: Secondary | ICD-10-CM

## 2023-05-01 DIAGNOSIS — Z0371 Encounter for suspected problem with amniotic cavity and membrane ruled out: Secondary | ICD-10-CM | POA: Insufficient documentation

## 2023-05-01 DIAGNOSIS — O09893 Supervision of other high risk pregnancies, third trimester: Secondary | ICD-10-CM | POA: Diagnosis not present

## 2023-05-01 DIAGNOSIS — O99212 Obesity complicating pregnancy, second trimester: Secondary | ICD-10-CM

## 2023-05-01 DIAGNOSIS — Z3493 Encounter for supervision of normal pregnancy, unspecified, third trimester: Secondary | ICD-10-CM

## 2023-05-01 LAB — RUPTURE OF MEMBRANE (ROM)PLUS: Rom Plus: NEGATIVE

## 2023-05-01 NOTE — Discharge Instructions (Signed)
 Ms. Giauque,  It was a pleasure caring for you in MAU. It was determined that your water has not broken via lab testing, ultrasound and physical exam. However, please come back and see us  if you are having more than 5 painful contractions an hour, or feel that you are continuing or starting to leak fluid.  Thank you for trusting us  to care for you, Abraham Hoffmann, Midwife

## 2023-05-01 NOTE — Telephone Encounter (Signed)
 Returned pt's call and she reported that she has been leaking clear odorless fluid since the beginning of the week. Denies contractions/bleeding, reports good fetal movement. Pt states that she has been leaking 2-3 times per day and upon awakening. Next appt is Friday, consulted with in-office provider and advised pt to be evaluated at [facility].

## 2023-05-01 NOTE — MAU Provider Note (Signed)
 Chief Complaint:  Rupture of Membranes   HPI   None     Madeline Cooper is a 33 y.o. [id number] at [redacted]w[redacted]d who presents to maternity admissions reporting possible SROM 04/28/2023, she reports occasional leaking and wet spots since Sunday. She reports it got worse today and she noted fluid running down her leg. She describes the fluid as clear, without an odor. She denies VB or contractions. Last intercourse was last night. She endorses good fetal movement.   Pregnancy Course: GDM, LGA, short interval and history of shoulder dystocia  Past Medical History:  Diagnosis Date   Medical history non-contributory    OB History  Gravida Para Term Preterm AB Living  3 2 2   2   SAB IAB Ectopic Multiple Live Births     0 2    # Outcome Date GA Lbr Len/2nd Weight Sex Type Anes PTL Lv  3 Current           2 Term 10/11/21 [redacted]w[redacted]d 01:24 / 00:08 3430 g F Vag-Spont EPI  LIV  1 Term 09/10/20 [redacted]w[redacted]d 09:56 / 01:13 3756 g M Vag-Spont EPI  LIV   Past Surgical History:  Procedure Laterality Date   COSMETIC SURGERY  08/2018   Eyelids   TONSILECTOMY, ADENOIDECTOMY, BILATERAL MYRINGOTOMY AND TUBES  1999   TONSILLECTOMY     WISDOM TOOTH EXTRACTION  2009   Family History  Problem Relation Age of Onset   Hypertension Mother    Diabetes Mother    Hypertension Maternal Grandfather    Dementia Maternal Grandfather    Social History   Tobacco Use   Smoking status: Never   Smokeless tobacco: Never  Vaping Use   Vaping status: Never Used  Substance Use Topics   Alcohol use: Not Currently    Comment: Last drink January 2023   Drug use: Never   No Known Allergies Medications Prior to Admission  Medication Sig Dispense Refill Last Dose/Taking   Prenat-Fe Poly-Methfol-FA-DHA (VITAFOL  ULTRA) 29-0.6-0.4-200 MG CAPS Take 1 capsule by mouth daily. 30 capsule 11 04/30/2023   Accu-Chek Softclix Lancets lancets Use as instructed 100 each 12    aspirin  EC 81 MG tablet Take 1 tablet (81 mg total) by mouth daily. 60  tablet 2    Blood Glucose Monitoring Suppl (ACCU-CHEK GUIDE) w/Device KIT 1 Device by Does not apply route 4 (four) times daily. Please check blood sugar 4 times daily. Once in the morning when you first wake up before eating (fasting), and then 2 hours after each meal breakfast, lunch, and dinner. 1 kit 0    glucose blood test strip Please check blood sugar 4 times daily. Once in the morning when you first wake up before eating (fasting), and then 2 hours after each meal breakfast, lunch, and dinner. 100 each 12     I have reviewed patient's Past Medical Hx, Surgical Hx, Family Hx, Social Hx, medications and allergies.   ROS  Pertinent items noted in HPI and remainder of comprehensive ROS otherwise negative.   PHYSICAL EXAM  Patient Vitals for the past 24 hrs:  BP Temp Temp src Pulse Resp SpO2 Height Weight  05/01/23 1511 112/75 -- -- 95 -- 98 % -- --  05/01/23 1500 -- -- -- -- -- 98 % -- --  05/01/23 1437 111/64 98.1 F (36.7 C) Oral 97 18 97 % 5\' 4"  (1.626 m) 99.6 kg    Physical Exam Vitals and nursing note reviewed. Exam conducted with a chaperone present.  Constitutional:  General: She is not in acute distress.    Appearance: Normal appearance. She is normal weight. She is not ill-appearing, toxic-appearing or diaphoretic.  Cardiovascular:     Rate and Rhythm: Normal rate.  Pulmonary:     Effort: Pulmonary effort is normal.  Genitourinary:    General: Normal vulva.     Cervix: Discharge present.     Comments: Speculum inserted to observe moderate amount of thin discharge. Specimen obtained for fern and ROM plus.   Skin:    General: Skin is warm.     Capillary Refill: Capillary refill takes less than 2 seconds.  Neurological:     General: No focal deficit present.     Mental Status: She is alert and oriented to person, place, and time.  Psychiatric:        Mood and Affect: Mood normal.        Behavior: Behavior normal.        Thought Content: Thought content normal.         Judgment: Judgment normal.      Dilation: Closed  Fetal Tracing: Baseline: 145 Variability:moderate Accelerations: 15x15 Decelerations: absent Toco: irregular   Labs: Results for orders placed or performed during the hospital encounter of 05/01/23 (from the past 24 hours)  Rupture of Membrane (ROM) Plus     Status: None   Collection Time: 05/01/23  3:17 PM  Result Value Ref Range   Rom Plus NEGATIVE     Imaging:  No results found.  MDM & MAU COURSE  MDM: Reviewed history, PE and speculum exam: fern x2, ROM plus and US  with AFI Extensive rule out rupture complete due to gestational age and one equivocal fern slide, with the remainder including PE negative  MAU Course: Orders Placed This Encounter  Procedures   US  MFM OB LIMITED   Rupture of Membrane (ROM) Plus   Fern Test   Discharge patient Discharge disposition: 01-Home or Self Care; Discharge patient date: 05/01/2023   No orders of the defined types were placed in this encounter.   ASSESSMENT   1. Supervision of high risk pregnancy, antepartum   2. Intact amniotic membranes during pregnancy in third trimester   3. [redacted] weeks gestation of pregnancy   4. Movement of fetus present during pregnancy in third trimester   5. NST (non-stress test) reactive   - Extensive rule out rupture complete due to gestational age and one equivocal fern slide, with the remainder including PE negative  PLAN  Discharge home in stable condition. Routine precautions given. Education provided regarding PTL, SROM and warning signs.    Follow-up Information     [facility] Follow up on 05/03/2023.   Specialty: Maternal and Fetal Medicine Contact information: [address] [place]  [id number] [phone]                Allergies as of 05/01/2023   No Known Allergies      Medication List     TAKE these medications    Accu-Chek  Guide w/Device Kit 1 Device by Does not apply route 4 (four) times daily. Please check blood sugar 4 times daily. Once in the morning when you first wake up before eating (fasting), and then 2 hours after each meal breakfast, lunch, and dinner.   Accu-Chek Softclix Lancets lancets Use as instructed   aspirin  EC 81 MG tablet Take 1 tablet (81 mg total) by mouth daily.  glucose blood test strip Please check blood sugar 4 times daily. Once in the morning when you first wake up before eating (fasting), and then 2 hours after each meal breakfast, lunch, and dinner.   Vitafol  Ultra 29-0.6-0.4-200 MG Caps Take 1 capsule by mouth daily.        Raford Bunk, MSN, CNM 05/01/2023 6:03 PM  Certified Nurse Midwife, [facility]

## 2023-05-01 NOTE — MAU Note (Addendum)
 Madeline Cooper is a 33 y.o. at [redacted]w[redacted]d here in MAU reporting: she has noticed a few wet spots on her clothing and areas she's been sitting in since Sunday, but today actually had some trickling of fluid down her leg.  Reports fluid is clear ands odorless.  States last intercourse was last night.  Denies VB.  Reports +FM.    LMP: 08/21/2022 Onset of complaint: Sunday Pain score: 0 Vitals:   05/01/23 1437  BP: 111/64  Pulse: 97  Resp: 18  Temp: 98.1 F (36.7 C)  SpO2: 97%     FHT: 140 bpm  Lab orders placed from triage: None

## 2023-05-03 ENCOUNTER — Other Ambulatory Visit: Payer: Self-pay | Admitting: Obstetrics

## 2023-05-03 ENCOUNTER — Other Ambulatory Visit: Payer: Self-pay | Admitting: *Deleted

## 2023-05-03 ENCOUNTER — Ambulatory Visit: Attending: Obstetrics

## 2023-05-03 ENCOUNTER — Ambulatory Visit (INDEPENDENT_AMBULATORY_CARE_PROVIDER_SITE_OTHER): Admitting: Obstetrics and Gynecology

## 2023-05-03 ENCOUNTER — Encounter: Payer: Self-pay | Admitting: Obstetrics and Gynecology

## 2023-05-03 ENCOUNTER — Ambulatory Visit (HOSPITAL_BASED_OUTPATIENT_CLINIC_OR_DEPARTMENT_OTHER): Admitting: Obstetrics

## 2023-05-03 VITALS — BP 124/78 | HR 89 | Wt 222.0 lb

## 2023-05-03 DIAGNOSIS — Z3A35 35 weeks gestation of pregnancy: Secondary | ICD-10-CM

## 2023-05-03 DIAGNOSIS — E669 Obesity, unspecified: Secondary | ICD-10-CM

## 2023-05-03 DIAGNOSIS — O24419 Gestational diabetes mellitus in pregnancy, unspecified control: Secondary | ICD-10-CM

## 2023-05-03 DIAGNOSIS — O99213 Obesity complicating pregnancy, third trimester: Secondary | ICD-10-CM

## 2023-05-03 DIAGNOSIS — O3660X0 Maternal care for excessive fetal growth, unspecified trimester, not applicable or unspecified: Secondary | ICD-10-CM

## 2023-05-03 DIAGNOSIS — O0990 Supervision of high risk pregnancy, unspecified, unspecified trimester: Secondary | ICD-10-CM

## 2023-05-03 DIAGNOSIS — O09299 Supervision of pregnancy with other poor reproductive or obstetric history, unspecified trimester: Secondary | ICD-10-CM

## 2023-05-03 DIAGNOSIS — O285 Abnormal chromosomal and genetic finding on antenatal screening of mother: Secondary | ICD-10-CM | POA: Diagnosis not present

## 2023-05-03 DIAGNOSIS — O24410 Gestational diabetes mellitus in pregnancy, diet controlled: Secondary | ICD-10-CM

## 2023-05-03 DIAGNOSIS — O3663X0 Maternal care for excessive fetal growth, third trimester, not applicable or unspecified: Secondary | ICD-10-CM

## 2023-05-03 NOTE — Progress Notes (Signed)
   PRENATAL VISIT NOTE  Subjective:  Madeline Cooper is a 33 y.o. G3P2002 at [redacted]w[redacted]d being seen today for ongoing prenatal care.  She is currently monitored for the following issues for this high-risk pregnancy and has History of shoulder dystocia in prior pregnancy, currently pregnant; Short interval between pregnancies affecting pregnancy in third trimester, antepartum; BMI 36.0-36.9,adult; Obesity in pregnancy, antepartum, third trimester; Supervision of high risk pregnancy, antepartum; "No results" on Panorama; Gestational diabetes mellitus (GDM) in third trimester; and LGA (large for gestational age) fetus affecting management of mother, third trimester on their problem list.  Patient reports  went to hospital for possible ROM, neg test, normal u/s. No leaking since then  .  Contractions: Not present. Vag. Bleeding: None.  Movement: Present. Denies leaking of fluid.   The following portions of the patient's history were reviewed and updated as appropriate: allergies, current medications, past family history, past medical history, past social history, past surgical history and problem list.   Objective:   Vitals:   05/03/23 1039  BP: 124/78  Pulse: 89  Weight: 222 lb (100.7 kg)    Fetal Status: Fetal Heart Rate (bpm): 139   Movement: Present     General:  Alert, oriented and cooperative. Patient is in no acute distress.  Skin: Skin is warm and dry. No rash noted.   Cardiovascular: Normal heart rate noted  Respiratory: Normal respiratory effort, no problems with respiration noted  Abdomen: Soft, gravid, appropriate for gestational age.  Pain/Pressure: Absent     Pelvic: Cervical exam deferred        Extremities: Normal range of motion.  Edema: None  Mental Status: Normal mood and affect. Normal behavior. Normal judgment and thought content.   Assessment and Plan:  Pregnancy: G3P2002 at [redacted]w[redacted]d 1. Supervision of high risk pregnancy, antepartum (Primary) BP and FHR normal Doing well,  feeling regular movement    2. Gestational diabetes mellitus (GDM) in third trimester, gestational diabetes method of control unspecified Reports overall fasting and pp controlled with diet, with 2 outliers. Encouraged bringing log to appt   3. LGA (large for gestational age) fetus affecting management of mother, third trimester 3/7 EFW > 99%, afi normal Follow up growth today    4. History of shoulder dystocia in prior pregnancy, currently pregnant Follow up growth with MFM today   5. [redacted] weeks gestation of pregnancy Anticipatory guidance regarding swabs next visit     Preterm labor symptoms and general obstetric precautions including but not limited to vaginal bleeding, contractions, leaking of fluid and fetal movement were reviewed in detail with the patient. Please refer to After Visit Summary for other counseling recommendations.   Return in about 1 week (around 05/10/2023) for OB VISIT (MD or APP).  Future Appointments  Date Time Provider Department Center  05/03/2023  2:30 PM [facility] US5 [facility]  05/10/2023  8:55 AM Tresia Fruit, MD [facility] None    Susi Eric, FNP

## 2023-05-03 NOTE — Progress Notes (Signed)
 Pt presents for ROB visit. Pt was seen at MAU on 4/30. No leaking since.

## 2023-05-03 NOTE — Progress Notes (Signed)
 MFM Consult Note  Madeline Cooper is currently at 35 weeks and 1 day.  She has been followed due to diet-controlled gestational diabetes.  She reports that her fingerstick values have mostly been within normal limits.  Her first pregnancy where the birth weight was 8 pounds 5 ounces, was complicated by a shoulder dystocia that resolved after a brief time.  On today's exam, the overall EFW of 8 pounds 8 ounces measures at greater than the 99th percentile for her gestational age.    There was normal amniotic fluid noted with a total AFI of 15.04 cm.  A BPP performed today was 8 out of 8.  Due to gestational diabetes with a large for gestational age fetus, we will continue to follow her with weekly fetal testing until delivery.  Delivery should be considered at between 37 to 38 weeks.  As the EFW noted today is even larger than the birthweight of her first child who experienced shoulder dystocia, the patient was advised that the only way to avoid another shoulder dystocia would be a cesarean delivery.    The patient and her husband will consider the mode of delivery and will inform us  of their decision during her future visits.    She will return in 1 week for an NST.    The patient and her husband stated that all of their questions were answered.  A total of 20 minutes was spent counseling and coordinating the care for this patient.  Greater than 50% of the time was spent in direct face-to-face contact.

## 2023-05-08 ENCOUNTER — Other Ambulatory Visit: Payer: Self-pay

## 2023-05-08 MED ORDER — ACCU-CHEK GUIDE TEST VI STRP: 1.0000 | ORAL_STRIP | Freq: Four times a day (QID) | 12 refills | Status: DC

## 2023-05-10 ENCOUNTER — Ambulatory Visit: Admitting: Obstetrics & Gynecology

## 2023-05-10 ENCOUNTER — Other Ambulatory Visit (HOSPITAL_COMMUNITY)
Admission: RE | Admit: 2023-05-10 | Discharge: 2023-05-10 | Disposition: A | Discharge status: Home / Self Care | Source: Ambulatory Visit | Attending: Obstetrics & Gynecology | Admitting: Obstetrics & Gynecology

## 2023-05-10 VITALS — BP 106/71 | HR 89 | Wt 221.0 lb

## 2023-05-10 DIAGNOSIS — Z3A36 36 weeks gestation of pregnancy: Secondary | ICD-10-CM

## 2023-05-10 DIAGNOSIS — O0990 Supervision of high risk pregnancy, unspecified, unspecified trimester: Secondary | ICD-10-CM

## 2023-05-10 DIAGNOSIS — O24419 Gestational diabetes mellitus in pregnancy, unspecified control: Secondary | ICD-10-CM | POA: Diagnosis not present

## 2023-05-10 DIAGNOSIS — O3663X0 Maternal care for excessive fetal growth, third trimester, not applicable or unspecified: Secondary | ICD-10-CM

## 2023-05-10 DIAGNOSIS — Z1339 Encounter for screening examination for other mental health and behavioral disorders: Secondary | ICD-10-CM

## 2023-05-10 DIAGNOSIS — O99213 Obesity complicating pregnancy, third trimester: Secondary | ICD-10-CM | POA: Diagnosis not present

## 2023-05-10 MED ORDER — GLUCOSE BLOOD VI STRP: ORAL_STRIP | 12 refills | Status: DC

## 2023-05-10 NOTE — Progress Notes (Signed)
   PRENATAL VISIT NOTE  Subjective:  Madeline Cooper is a 33 y.o. G3P2002 at [redacted]w[redacted]d being seen today for ongoing prenatal care.  She is currently monitored for the following issues for this high-risk pregnancy and has History of shoulder dystocia in prior pregnancy, currently pregnant; Short interval between pregnancies affecting pregnancy in third trimester, antepartum; BMI 36.0-36.9,adult; Obesity in pregnancy, antepartum, third trimester; Supervision of high risk pregnancy, antepartum; "No results" on Panorama; Gestational diabetes mellitus (GDM) in third trimester; and LGA (large for gestational age) fetus affecting management of mother, third trimester on their problem list.  Patient reports occasional contractions.  Contractions: Not present. Vag. Bleeding: None.  Movement: Present. Denies leaking of fluid.   The following portions of the patient's history were reviewed and updated as appropriate: allergies, current medications, past family history, past medical history, past social history, past surgical history and problem list.   Objective:   Vitals:   05/10/23 0915  BP: 106/71  Pulse: 89  Weight: 221 lb (100.2 kg)     Fetal Status: Fetal Heart Rate (bpm): 131   Movement: Present  Presentation: Vertex   General: Alert, oriented and cooperative. Patient is in no acute distress.  Skin: Skin is warm and dry. No rash noted.   Cardiovascular: Normal heart rate noted  Respiratory: Normal respiratory effort, no problems with respiration noted  Abdomen: Soft, gravid, appropriate for gestational age.  Pain/Pressure: Present     Pelvic: Cervical exam performed in the presence of a chaperone Dilation: 1 Effacement (%): 20 Station: Ballotable  Extremities: Normal range of motion.  Edema: None  Mental Status: Normal mood and affect. Normal behavior. Normal judgment and thought content.   Assessment and Plan:  Pregnancy: G3P2002 at [redacted]w[redacted]d 1. Supervision of high risk pregnancy, antepartum  (Primary)  - Cervicovaginal ancillary only( [place]) - Culture, beta strep (group b only) - glucose blood test strip; Please check blood sugar 4 times daily. Once in the morning when you first wake up before eating (fasting), and then 2 hours after each meal breakfast, lunch, and dinner.  Dispense: 100 each; Refill: 12  2. [redacted] weeks gestation of pregnancy  - Cervicovaginal ancillary only( ) - Culture, beta strep (group b only)  3. Gestational diabetes mellitus (GDM) in third trimester, gestational diabetes method of control unspecified LGA and h/o brief shoulder dystocia G1, patient want vaginal birth and counseled again about risk of dystocia - glucose blood test strip; Please check blood sugar 4 times daily. Once in the morning when you first wake up before eating (fasting), and then 2 hours after each meal breakfast, lunch, and dinner.  Dispense: 100 each; Refill: 12 - Fetal nonstress test - CBC; Standing - Type and screen; Standing - RPR; Standing  4. Obesity in pregnancy, antepartum, third trimester   5. LGA (large for gestational age) fetus affecting management of mother, third trimester IOL 37 weeks per MFM  Term labor symptoms and general obstetric precautions including but not limited to vaginal bleeding, contractions, leaking of fluid and fetal movement were reviewed in detail with the patient. Please refer to After Visit Summary for other counseling recommendations.   Return if symptoms worsen or fail to improve.  Future Appointments  Date Time Provider Department Center  05/17/2023 11:00 AM [facility] PROVIDER 1 [facility]  05/17/2023 11:30 AM [facility] US4 [facility]    Onnie Bilis, MD

## 2023-05-10 NOTE — Progress Notes (Signed)
 ROB/GBS.  Pt has questions about her CS and scheduling.

## 2023-05-13 ENCOUNTER — Encounter: Payer: Self-pay | Admitting: Obstetrics & Gynecology

## 2023-05-13 LAB — CERVICOVAGINAL ANCILLARY ONLY
Chlamydia: NEGATIVE
Comment: NEGATIVE
Comment: NORMAL
Neisseria Gonorrhea: NEGATIVE

## 2023-05-14 LAB — CULTURE, BETA STREP (GROUP B ONLY): Strep Gp B Culture: NEGATIVE

## 2023-05-16 ENCOUNTER — Encounter (HOSPITAL_COMMUNITY): Payer: Self-pay | Admitting: Obstetrics & Gynecology

## 2023-05-16 ENCOUNTER — Inpatient Hospital Stay (HOSPITAL_COMMUNITY)
Admission: RE | Admit: 2023-05-16 | Discharge: 2023-05-18 | DRG: 807 | Disposition: A | Discharge status: Home / Self Care | Source: Ambulatory Visit | Attending: Obstetrics and Gynecology | Admitting: Obstetrics and Gynecology

## 2023-05-16 ENCOUNTER — Inpatient Hospital Stay (HOSPITAL_COMMUNITY): Admitting: Anesthesiology

## 2023-05-16 ENCOUNTER — Inpatient Hospital Stay (HOSPITAL_COMMUNITY)

## 2023-05-16 DIAGNOSIS — O99213 Obesity complicating pregnancy, third trimester: Secondary | ICD-10-CM | POA: Diagnosis present

## 2023-05-16 DIAGNOSIS — O99214 Obesity complicating childbirth: Secondary | ICD-10-CM | POA: Diagnosis present

## 2023-05-16 DIAGNOSIS — Z6836 Body mass index (BMI) 36.0-36.9, adult: Secondary | ICD-10-CM

## 2023-05-16 DIAGNOSIS — Z3A37 37 weeks gestation of pregnancy: Secondary | ICD-10-CM

## 2023-05-16 DIAGNOSIS — O24410 Gestational diabetes mellitus in pregnancy, diet controlled: Principal | ICD-10-CM | POA: Diagnosis present

## 2023-05-16 DIAGNOSIS — Z833 Family history of diabetes mellitus: Secondary | ICD-10-CM

## 2023-05-16 DIAGNOSIS — Z8249 Family history of ischemic heart disease and other diseases of the circulatory system: Secondary | ICD-10-CM | POA: Diagnosis not present

## 2023-05-16 DIAGNOSIS — O24419 Gestational diabetes mellitus in pregnancy, unspecified control: Secondary | ICD-10-CM

## 2023-05-16 DIAGNOSIS — O0990 Supervision of high risk pregnancy, unspecified, unspecified trimester: Secondary | ICD-10-CM

## 2023-05-16 DIAGNOSIS — O3663X0 Maternal care for excessive fetal growth, third trimester, not applicable or unspecified: Secondary | ICD-10-CM | POA: Diagnosis present

## 2023-05-16 DIAGNOSIS — O09299 Supervision of pregnancy with other poor reproductive or obstetric history, unspecified trimester: Secondary | ICD-10-CM

## 2023-05-16 DIAGNOSIS — O280 Abnormal hematological finding on antenatal screening of mother: Secondary | ICD-10-CM | POA: Diagnosis present

## 2023-05-16 DIAGNOSIS — O24420 Gestational diabetes mellitus in childbirth, diet controlled: Secondary | ICD-10-CM | POA: Diagnosis present

## 2023-05-16 DIAGNOSIS — O09893 Supervision of other high risk pregnancies, third trimester: Secondary | ICD-10-CM

## 2023-05-16 LAB — GLUCOSE, CAPILLARY
Glucose-Capillary: 91 mg/dL (ref 70–99)
Glucose-Capillary: 110 mg/dL — ABNORMAL HIGH (ref 70–99)
Glucose-Capillary: 83 mg/dL (ref 70–99)
Glucose-Capillary: 88 mg/dL (ref 70–99)
Glucose-Capillary: 82 mg/dL (ref 70–99)

## 2023-05-16 LAB — CBC
HCT: 37.8 % (ref 36.0–46.0)
Hemoglobin: 12.4 g/dL (ref 12.0–15.0)
MCH: 29.4 pg (ref 26.0–34.0)
MCHC: 32.8 g/dL (ref 30.0–36.0)
MCV: 89.6 fL (ref 80.0–100.0)
Platelets: 258 10*3/uL (ref 150–400)
RBC: 4.22 MIL/uL (ref 3.87–5.11)
RDW: 14.1 % (ref 11.5–15.5)
WBC: 9.5 10*3/uL (ref 4.0–10.5)
nRBC: 0 % (ref 0.0–0.2)

## 2023-05-16 LAB — TYPE AND SCREEN
ABO/RH(D): B POS
Antibody Screen: NEGATIVE

## 2023-05-16 LAB — RPR: RPR Ser Ql: NONREACTIVE

## 2023-05-16 MED ORDER — MISOPROSTOL 25 MCG QUARTER TABLET: 25.0000 ug | ORAL_TABLET | Freq: Once | ORAL | Status: DC

## 2023-05-16 MED ORDER — BUPIVACAINE HCL (PF) 0.25 % IJ SOLN
INTRAMUSCULAR | Status: DC | PRN
  Administered 2023-05-16: 5 mL via EPIDURAL
  Administered 2023-05-16: 4 mL via EPIDURAL

## 2023-05-16 MED ORDER — LACTATED RINGERS IV SOLN: INTRAVENOUS | Status: DC

## 2023-05-16 MED ORDER — OXYTOCIN-SODIUM CHLORIDE 30-0.9 UT/500ML-% IV SOLN
1.0000 m[IU]/min | INTRAVENOUS | Status: DC
  Administered 2023-05-16: 2 m[IU]/min via INTRAVENOUS

## 2023-05-16 MED ORDER — TERBUTALINE SULFATE 1 MG/ML IJ SOLN: 0.2500 mg | Freq: Once | INTRAMUSCULAR | Status: DC | PRN

## 2023-05-16 MED ORDER — FENTANYL-BUPIVACAINE-NACL 0.5-0.125-0.9 MG/250ML-% EP SOLN
12.0000 mL/h | EPIDURAL | Status: DC | PRN
  Administered 2023-05-16: 12 mL/h via EPIDURAL

## 2023-05-16 MED ORDER — MISOPROSTOL 50MCG HALF TABLET: 50.0000 ug | ORAL_TABLET | Freq: Once | ORAL | Status: DC

## 2023-05-16 MED ORDER — LIDOCAINE HCL (PF) 1 % IJ SOLN: 30.0000 mL | INTRAMUSCULAR | Status: DC | PRN

## 2023-05-16 MED ORDER — PHENYLEPHRINE 80 MCG/ML (10ML) SYRINGE FOR IV PUSH (FOR BLOOD PRESSURE SUPPORT): 80.0000 ug | PREFILLED_SYRINGE | INTRAVENOUS | Status: DC | PRN

## 2023-05-16 MED ORDER — EPHEDRINE 5 MG/ML INJ: 10.0000 mg | INTRAVENOUS | Status: DC | PRN

## 2023-05-16 MED ORDER — MISOPROSTOL 25 MCG QUARTER TABLET
25.0000 ug | ORAL_TABLET | Freq: Once | ORAL | Status: AC
  Administered 2023-05-16: 25 ug via VAGINAL
  Filled 2023-05-16: qty 1

## 2023-05-16 MED ORDER — OXYTOCIN BOLUS FROM INFUSION
333.0000 mL | Freq: Once | INTRAVENOUS | Status: AC
  Administered 2023-05-17: 333 mL via INTRAVENOUS

## 2023-05-16 MED ORDER — OXYTOCIN-SODIUM CHLORIDE 30-0.9 UT/500ML-% IV SOLN
2.5000 [IU]/h | INTRAVENOUS | Status: DC
  Administered 2023-05-17: 2.5 [IU]/h via INTRAVENOUS
  Filled 2023-05-16: qty 500

## 2023-05-16 MED ORDER — ONDANSETRON HCL 4 MG/2ML IJ SOLN
4.0000 mg | Freq: Four times a day (QID) | INTRAMUSCULAR | Status: DC | PRN
Start: 2023-05-16 — End: 2023-05-17

## 2023-05-16 MED ORDER — OXYCODONE-ACETAMINOPHEN 5-325 MG PO TABS: 2.0000 | ORAL_TABLET | ORAL | Status: DC | PRN

## 2023-05-16 MED ORDER — LACTATED RINGERS IV SOLN
500.0000 mL | Freq: Once | INTRAVENOUS | Status: AC
  Administered 2023-05-16: 500 mL via INTRAVENOUS

## 2023-05-16 MED ORDER — FENTANYL-BUPIVACAINE-NACL 0.5-0.125-0.9 MG/250ML-% EP SOLN
EPIDURAL | Status: AC
  Filled 2023-05-16: qty 250

## 2023-05-16 MED ORDER — ACETAMINOPHEN 325 MG PO TABS: 650.0000 mg | ORAL_TABLET | ORAL | Status: DC | PRN

## 2023-05-16 MED ORDER — SOD CITRATE-CITRIC ACID 500-334 MG/5ML PO SOLN: 30.0000 mL | ORAL | Status: DC | PRN

## 2023-05-16 MED ORDER — OXYCODONE-ACETAMINOPHEN 5-325 MG PO TABS: 1.0000 | ORAL_TABLET | ORAL | Status: DC | PRN

## 2023-05-16 MED ORDER — MISOPROSTOL 50MCG HALF TABLET
50.0000 ug | ORAL_TABLET | Freq: Once | ORAL | Status: AC
  Administered 2023-05-16: 50 ug via ORAL
  Filled 2023-05-16: qty 1

## 2023-05-16 MED ORDER — DIPHENHYDRAMINE HCL 50 MG/ML IJ SOLN: 12.5000 mg | INTRAMUSCULAR | Status: DC | PRN

## 2023-05-16 MED ORDER — TERBUTALINE SULFATE 1 MG/ML IJ SOLN
0.2500 mg | Freq: Once | INTRAMUSCULAR | Status: DC | PRN
  Filled 2023-05-16: qty 1

## 2023-05-16 MED ORDER — LACTATED RINGERS IV SOLN
500.0000 mL | INTRAVENOUS | Status: DC | PRN
  Administered 2023-05-16: 500 mL via INTRAVENOUS

## 2023-05-16 NOTE — Anesthesia Preprocedure Evaluation (Signed)
 Anesthesia Evaluation  Patient identified by MRN, date of birth, ID band Patient awake    Reviewed: Allergy & Precautions, H&P , NPO status , Patient's Chart, lab work & pertinent test results  Airway Mallampati: I  TM Distance: >3 FB Neck ROM: full    Dental no notable dental hx. (+) Teeth Intact, Dental Advisory Given   Pulmonary neg pulmonary ROS   Pulmonary exam normal breath sounds clear to auscultation       Cardiovascular negative cardio ROS Normal cardiovascular exam Rhythm:regular Rate:Normal     Neuro/Psych negative neurological ROS  negative psych ROS   GI/Hepatic negative GI ROS, Neg liver ROS,,,  Endo/Other  diabetes, Well Controlled, Gestational    Renal/GU negative Renal ROS  negative genitourinary   Musculoskeletal negative musculoskeletal ROS (+)    Abdominal   Peds  Hematology negative hematology ROS (+)   Anesthesia Other Findings   Reproductive/Obstetrics (+) Pregnancy                             Anesthesia Physical Anesthesia Plan  ASA: 2  Anesthesia Plan: Epidural   Post-op Pain Management: Minimal or no pain anticipated   Induction:   PONV Risk Score and Plan:   Airway Management Planned:   Additional Equipment: None  Intra-op Plan:   Post-operative Plan:   Informed Consent: I have reviewed the patients History and Physical, chart, labs and discussed the procedure including the risks, benefits and alternatives for the proposed anesthesia with the patient or authorized representative who has indicated his/her understanding and acceptance.     Dental Advisory Given  Plan Discussed with: Anesthesiologist  Anesthesia Plan Comments: (Labs checked- platelets confirmed with RN in room. Fetal heart tracing, per RN, reported to be stable enough for sitting procedure. Discussed epidural, and patient consents to the procedure:  included risk of possible  headache,backache, failed block, allergic reaction, and nerve injury. This patient was asked if she had any questions or concerns before the procedure started.)        Anesthesia Quick Evaluation

## 2023-05-16 NOTE — Anesthesia Procedure Notes (Signed)
 Epidural Patient location during procedure: OB Start time: 05/16/2023 6:57 PM End time: 05/16/2023 7:05 PM  Staffing Anesthesiologist: Tura Gaines, MD Performed: anesthesiologist   Preanesthetic Checklist Completed: patient identified, IV checked, site marked, risks and benefits discussed, surgical consent, monitors and equipment checked, pre-op evaluation and timeout performed  Epidural Patient position: sitting Prep: DuraPrep and site prepped and draped Patient monitoring: continuous pulse ox and blood pressure Approach: midline Location: L3-L4 Injection technique: LOR air  Needle:  Needle type: Tuohy  Needle gauge: 17 G Needle length: 9 cm and 9 Needle insertion depth: 6 cm Catheter type: closed end flexible Catheter size: 19 Gauge Catheter at skin depth: 11 cm Test dose: negative and Other  Assessment Events: blood not aspirated, no cerebrospinal fluid, injection not painful, no injection resistance, no paresthesia and negative IV test  Additional Notes Patient identified. Risks and benefits discussed including failed block, incomplete  Pain control, post dural puncture headache, nerve damage, paralysis, blood pressure Changes, nausea, vomiting, reactions to medications-both toxic and allergic and post Partum back pain. All questions were answered. Patient expressed understanding and wished to proceed. Sterile technique was used throughout procedure. Epidural site was Dressed with sterile barrier dressing. No paresthesias, signs of intravascular injection Or signs of intrathecal spread were encountered.  Patient was more comfortable after the epidural was dosed. Please see RN's note for documentation of vital signs and FHR which are stable. Reason for block:procedure for pain

## 2023-05-16 NOTE — H&P (Signed)
 Madeline Cooper is a 33 y.o. G43P2002 female at [redacted]w[redacted]d by [redacted]w[redacted]d u/s presenting for IOL due to A1GDM w suspected LGA and hx mild SD.   Reports active fetal movement, contractions: irregular, every 2-6 minutes, vaginal bleeding: none, membranes: intact.  Initiated prenatal care at [facility] at 13 wks.    Most recent u/s: [redacted]w[redacted]d, EFW 3849g (>99%), AFI 15cm, cephalic, ant placenta.   This pregnancy complicated by: # A1GDM # hx 30s SD w G1 (Apgars 7/9, no sequela, 3756g) # suspected LGA   Prenatal History/Complications:  # see hx mild SD # term vag deliveries in 2022 and 2023  Past Medical History: Past Medical History:  Diagnosis Date   Medical history non-contributory     Past Surgical History: Past Surgical History:  Procedure Laterality Date   COSMETIC SURGERY  08/2018   Eyelids   TONSILECTOMY, ADENOIDECTOMY, BILATERAL MYRINGOTOMY AND TUBES  1999   TONSILLECTOMY     WISDOM TOOTH EXTRACTION  2009    Obstetrical History: OB History     Gravida  3   Para  2   Term  2   Preterm      AB      Living  2      SAB      IAB      Ectopic      Multiple  0   Live Births  2           Social History: Social History   Socioeconomic History   Marital status: Married    Spouse name: Not on file   Number of children: Not on file   Years of education: Not on file   Highest education level: Not on file  Occupational History   Not on file  Tobacco Use   Smoking status: Never   Smokeless tobacco: Never  Vaping Use   Vaping status: Never Used  Substance and Sexual Activity   Alcohol use: Not Currently    Comment: Last drink January 2023   Drug use: Never   Sexual activity: Yes    Partners: Male    Birth control/protection: None  Other Topics Concern   Not on file  Social History Narrative   Not on file   Social Drivers of Health   Financial Resource Strain: Not on file  Food Insecurity: No Food Insecurity (05/16/2023)   Hunger Vital Sign    Worried  About Running Out of Food in the Last Year: Never true    Ran Out of Food in the Last Year: Never true  Transportation Needs: No Transportation Needs (05/16/2023)   PRAPARE - Administrator, Civil Service (Medical): No    Lack of Transportation (Non-Medical): No  Physical Activity: Not on file  Stress: Not on file  Social Connections: Not on file    Family History: Family History  Problem Relation Age of Onset   Hypertension Mother    Diabetes Mother    Hypertension Maternal Grandfather    Dementia Maternal Grandfather     Allergies: No Known Allergies  Medications Prior to Admission  Medication Sig Dispense Refill Last Dose/Taking   Accu-Chek Softclix Lancets lancets Use as instructed (Patient not taking: Reported on 05/03/2023) 100 each 12    aspirin  EC 81 MG tablet Take 1 tablet (81 mg total) by mouth daily. (Patient not taking: Reported on 05/03/2023) 60 tablet 2    Blood Glucose Monitoring Suppl (ACCU-CHEK GUIDE) w/Device KIT 1 Device by Does not apply route 4 (  four) times daily. Please check blood sugar 4 times daily. Once in the morning when you first wake up before eating (fasting), and then 2 hours after each meal breakfast, lunch, and dinner. (Patient not taking: Reported on 05/03/2023) 1 kit 0    glucose blood (ACCU-CHEK GUIDE TEST) test strip 1 each by Other route 4 (four) times daily. Use as instructed 100 each 12    glucose blood test strip Please check blood sugar 4 times daily. Once in the morning when you first wake up before eating (fasting), and then 2 hours after each meal breakfast, lunch, and dinner. 100 each 12    Prenat-Fe Poly-Methfol-FA-DHA (VITAFOL  ULTRA) 29-0.6-0.4-200 MG CAPS Take 1 capsule by mouth daily. 30 capsule 11     Review of Systems  Pertinent pos/neg as indicated in HPI  Blood pressure 121/81, pulse 78, temperature 98.2 F (36.8 C), temperature source Oral, resp. rate 18, height 5\' 4"  (1.626 m), weight 101.5 kg, last menstrual period  08/21/2022, not currently breastfeeding. General appearance: alert, cooperative, and no distress Lungs: clear to auscultation bilaterally Heart: regular rate and rhythm Abdomen: gravid, soft, non-tender Extremities: 1+ edema  Fetal monitoring: FHR: 130-140s bpm, variability: moderate,  Accelerations: Present,  decelerations:  Present had a prolonged decel with total time of 8 mins to nadir of 80s with short periods of recovery to baseline in between Uterine activity: now q 2 mins Dilation: 1 Effacement (%): Thick Station: -3 Exam by:: Aleta Hutch, RN Presentation: cephalic   Prenatal labs: ABO, Rh: --/--/PENDING (05/15 1610) Antibody: PENDING (05/15 0626) Rubella: 1.59 (12/13 1044) RPR: Non Reactive (03/14 0941)  HBsAg: Negative (12/13 1044)  HIV: Non Reactive (03/14 0941)  GBS: Negative/-- (05/09 1007)  2hr GTT: 87/180/108  Prenatal Transfer Tool  Maternal Diabetes: Yes:  Diabetes Type:  Diet controlled Genetic Screening: Normal Maternal Ultrasounds/Referrals: Normal Fetal Ultrasounds or other Referrals:  Referred to Materal Fetal Medicine  Maternal Substance Abuse:  No Significant Maternal Medications:  None Significant Maternal Lab Results: Group B Strep negative  Results for orders placed or performed during the hospital encounter of 05/16/23 (from the past 24 hours)  CBC   Collection Time: 05/16/23  6:25 AM  Result Value Ref Range   WBC 9.5 4.0 - 10.5 K/uL   RBC 4.22 3.87 - 5.11 MIL/uL   Hemoglobin 12.4 12.0 - 15.0 g/dL   HCT 96.0 45.4 - 09.8 %   MCV 89.6 80.0 - 100.0 fL   MCH 29.4 26.0 - 34.0 pg   MCHC 32.8 30.0 - 36.0 g/dL   RDW 11.9 14.7 - 82.9 %   Platelets 258 150 - 400 K/uL   nRBC 0.0 0.0 - 0.2 %  Type and screen   Collection Time: 05/16/23  6:26 AM  Result Value Ref Range   ABO/RH(D) PENDING    Antibody Screen PENDING    Sample Expiration      05/19/2023,2359 Performed at [facility], [address], [place], [place] [address]   Glucose,  capillary   Collection Time: 05/16/23  6:36 AM  Result Value Ref Range   Glucose-Capillary 91 70 - 99 mg/dL     Assessment:  [redacted]w[redacted]d SIUP  G3P2002  A1GDM  Suspected LGA  Cat 1 FHR  GBS Negative/-- (05/09 1007)  Plan:  Admit to L&D  IV pain meds/epidural prn active labor  Plan cx ripening w dual cytotec  to start, followed by options of repeat cytotec , cervical foley, Pit/AROM  CBGs q 4h  Anticipate vag delivery;  discussed with parents the previous option they were considering of a pLTCS> they are no longer interested in that and elect for a vag delivery, with verbalized understanding of the risks of SD   Plans to breast and bottlefeed  Contraception: NFP  Circumcision: yes  Jolayne Natter CNM 05/16/2023, 7:22 AM

## 2023-05-16 NOTE — Progress Notes (Signed)
 Labor Progress Note Madeline Cooper is a 33 y.o. G3P2002 at [redacted]w[redacted]d presented for IOL for A1GDM, LGA, hx of prior mild SD S: Patient evaluated at the bedside, and amenable to pelvic exam. She is comfortable and feeling occasional contractions.   O:  BP 109/80   Pulse 75   Temp 98.2 F (36.8 C) (Oral)   Resp 18   Ht 5\' 4"  (1.626 m)   Wt 101.5 kg   LMP 08/21/2022 (Exact Date)   BMI 38.40 kg/m  EFM: Baseline 140s/mod variability/accels/no decels  CVE: Dilation: 2 Effacement (%): 50 Station: -2 Presentation: Vertex Exam by:: Dr. Irving Mantle  A&P: 33 y.o. [id number] [redacted]w[redacted]d IOL for A1GDM, LGA #Labor: Progressing well. Is not interested in Foley balloon at this time. Given that she has made progress on her own, and is contractying q1-66min. Reasonable to watch for 4 hours, and if contractions space out, will start pitocin  #Pain: per patient request #FWB: Category 1 #GBS negative  Frutoso Jing, MD 12:23 PM

## 2023-05-16 NOTE — Progress Notes (Signed)
 Labor Progress Note Madeline Cooper is a 33 y.o. G3P2002 at [redacted]w[redacted]d presented for IOL for A1GDM, LGA, hx of prior mild SD  S: Patient evaluated at bedside, and amenable to pelvic exam. She remains comfortable, and is still feeling frequent contractions.  O:  BP 107/62   Pulse (!) 58   Temp 98.6 F (37 C) (Oral)   Resp 16   Ht 5\' 4"  (1.626 m)   Wt 101.5 kg   LMP 08/21/2022 (Exact Date)   BMI 38.40 kg/m  EFM: Baseline 140s/mod variability/accels/no decels   CVE: Dilation: 2 Effacement (%): 50 Station: Ballotable Presentation: Vertex Exam by:: Dr. Irving Mantle   A&P: 33 y.o. [id number] [redacted]w[redacted]d IOL for A1GDM, LGA  #Labor: Exam unchanged, and decreasing frequency of contractions noted on TOCO. Given that exam remains unchanged, discussed possibility of placing a Foley balloon vs repeat Cytotec  vs starting Pitocin . Patient had negative experience with Foley balloon during prior delivery and would like to proceed with Pitocin  augmentation #Pain: well controlled, per patient request. #FWB: Category I #GBS negative  Madeline Jing, MD 4:24 PM

## 2023-05-16 NOTE — Progress Notes (Signed)
 LABOR PROGRESS NOTE  Patient Name: Madeline Cooper, female   DOB: July 26, 1990, 33 y.o.  MRN: [id number]  Pt comfortable with epidural. Open to AROM after discussion of risks, benefits and indications. Bedside US  performed for fetal position given high station; vertex ROT.   Blood pressure (!) 105/58, pulse 62, temperature 98.7 F (37.1 C), temperature source Axillary, resp. rate 14, height 5\' 4"  (1.626 m), weight 101.5 kg, last menstrual period 08/21/2022, SpO2 99%, not currently breastfeeding.  EFM: baseline 135, accels, no decels, moderate variability TOCO: q2-4min contractions  Continue pitocin  titration. Spinning babies circuit to facilitate improved fetal station then AROM as able. Will reassess in a few hours.   #GDM CBG (last 3)  Recent Labs    05/16/23 1025 05/16/23 1419 05/16/23 1758  GLUCAP 110* 83 88     Darrow End, MD

## 2023-05-16 NOTE — Progress Notes (Signed)
 Madeline Cooper is a 33 y.o. G3P2002 at [redacted]w[redacted]d by 9 week ultrasound admitted for induction of labor due to A1GDM, LGA, EFW 3849 @ [redacted]w[redacted]d. Previous baby 8lb 5oz with mild  SD.   Subjective: Pt comfortable, feeling some irregular cramping.  Husband at bedside for support.   Objective: BP 134/80   Pulse 76   Temp 98.2 F (36.8 C) (Oral)   Resp 18   Ht 5\' 4"  (1.626 m)   Wt 101.5 kg   LMP 08/21/2022 (Exact Date)   BMI 38.40 kg/m  No intake/output data recorded. No intake/output data recorded.  FHT:  FHR: 140 bpm, variability: moderate,  accelerations:  Present,  decelerations:  Absent UC:   regular, every 1-3 minutes SVE:   Deferred, 1 cm on previous exam.    Labs: Lab Results  Component Value Date   WBC 9.5 05/16/2023   HGB 12.4 05/16/2023   HCT 37.8 05/16/2023   MCV 89.6 05/16/2023   PLT 258 05/16/2023    Assessment / Plan: Induction of labor due to A1GDM, LGA S/P dual Cytotec  at 0700  Labor: Progressing normally Preeclampsia:  n/a Fetal Wellbeing:  Category I Pain Control:  Labor support without medications I/D:  GBS neg Anticipated MOD:  NSVD  Arlester Bence, CNM 05/16/2023, 10:00 AM

## 2023-05-17 ENCOUNTER — Ambulatory Visit

## 2023-05-17 ENCOUNTER — Encounter (HOSPITAL_COMMUNITY): Payer: Self-pay | Admitting: Obstetrics & Gynecology

## 2023-05-17 ENCOUNTER — Other Ambulatory Visit

## 2023-05-17 DIAGNOSIS — O24420 Gestational diabetes mellitus in childbirth, diet controlled: Secondary | ICD-10-CM

## 2023-05-17 DIAGNOSIS — Z3A37 37 weeks gestation of pregnancy: Secondary | ICD-10-CM

## 2023-05-17 DIAGNOSIS — O3663X0 Maternal care for excessive fetal growth, third trimester, not applicable or unspecified: Secondary | ICD-10-CM

## 2023-05-17 LAB — GLUCOSE, CAPILLARY
Glucose-Capillary: 76 mg/dL (ref 70–99)
Glucose-Capillary: 62 mg/dL — ABNORMAL LOW (ref 70–99)
Glucose-Capillary: 84 mg/dL (ref 70–99)
Glucose-Capillary: 56 mg/dL — ABNORMAL LOW (ref 70–99)
Glucose-Capillary: 62 mg/dL — ABNORMAL LOW (ref 70–99)
Glucose-Capillary: 67 mg/dL — ABNORMAL LOW (ref 70–99)
Glucose-Capillary: 81 mg/dL (ref 70–99)
Glucose-Capillary: 62 mg/dL — ABNORMAL LOW (ref 70–99)
Glucose-Capillary: 78 mg/dL (ref 70–99)

## 2023-05-17 MED ORDER — BENZOCAINE-MENTHOL 20-0.5 % EX AERO: 1.0000 | INHALATION_SPRAY | CUTANEOUS | Status: DC | PRN

## 2023-05-17 MED ORDER — METHYLERGONOVINE MALEATE 0.2 MG/ML IJ SOLN
0.2000 mg | Freq: Once | INTRAMUSCULAR | Status: AC
  Administered 2023-05-17: 0.2 mg via INTRAMUSCULAR

## 2023-05-17 MED ORDER — PRENATAL MULTIVITAMIN CH
1.0000 | ORAL_TABLET | Freq: Every day | ORAL | Status: DC
  Administered 2023-05-17 – 2023-05-18 (×2): 1 via ORAL
  Filled 2023-05-17 (×2): qty 1

## 2023-05-17 MED ORDER — WITCH HAZEL-GLYCERIN EX PADS: 1.0000 | MEDICATED_PAD | CUTANEOUS | Status: DC | PRN

## 2023-05-17 MED ORDER — METHYLERGONOVINE MALEATE 0.2 MG/ML IJ SOLN
INTRAMUSCULAR | Status: AC
  Filled 2023-05-17: qty 1

## 2023-05-17 MED ORDER — SODIUM CHLORIDE 0.9 % IV SOLN: 250.0000 mL | INTRAVENOUS | Status: DC | PRN

## 2023-05-17 MED ORDER — GLUCOSE 40 % PO GEL
1.0000 | Freq: Once | ORAL | Status: AC | PRN
  Administered 2023-05-17: 31 g via ORAL
  Filled 2023-05-17: qty 1.21

## 2023-05-17 MED ORDER — SODIUM CHLORIDE 0.9% FLUSH
3.0000 mL | Freq: Two times a day (BID) | INTRAVENOUS | Status: DC
  Administered 2023-05-17: 3 mL via INTRAVENOUS

## 2023-05-17 MED ORDER — SENNOSIDES-DOCUSATE SODIUM 8.6-50 MG PO TABS
2.0000 | ORAL_TABLET | ORAL | Status: DC
  Administered 2023-05-18: 2 via ORAL
  Filled 2023-05-17: qty 2

## 2023-05-17 MED ORDER — DIBUCAINE (PERIANAL) 1 % EX OINT: 1.0000 | TOPICAL_OINTMENT | CUTANEOUS | Status: DC | PRN

## 2023-05-17 MED ORDER — SODIUM CHLORIDE 0.9% FLUSH: 3.0000 mL | INTRAVENOUS | Status: DC | PRN

## 2023-05-17 MED ORDER — IBUPROFEN 800 MG PO TABS
800.0000 mg | ORAL_TABLET | Freq: Three times a day (TID) | ORAL | Status: DC
  Administered 2023-05-17 – 2023-05-18 (×4): 800 mg via ORAL
  Filled 2023-05-17 (×4): qty 1

## 2023-05-17 MED ORDER — ACETAMINOPHEN 325 MG PO TABS: 650.0000 mg | ORAL_TABLET | ORAL | Status: DC | PRN

## 2023-05-17 MED ORDER — COCONUT OIL OIL: 1.0000 | TOPICAL_OIL | Status: DC | PRN

## 2023-05-17 MED ORDER — ONDANSETRON HCL 4 MG/2ML IJ SOLN: 4.0000 mg | INTRAMUSCULAR | Status: DC | PRN

## 2023-05-17 MED ORDER — DIPHENHYDRAMINE HCL 25 MG PO CAPS: 25.0000 mg | ORAL_CAPSULE | Freq: Four times a day (QID) | ORAL | Status: DC | PRN

## 2023-05-17 MED ORDER — ONDANSETRON HCL 4 MG PO TABS: 4.0000 mg | ORAL_TABLET | ORAL | Status: DC | PRN

## 2023-05-17 MED ORDER — SIMETHICONE 80 MG PO CHEW: 80.0000 mg | CHEWABLE_TABLET | ORAL | Status: DC | PRN

## 2023-05-17 MED ORDER — DEXTROSE 50 % IV SOLN
INTRAVENOUS | Status: AC
  Administered 2023-05-17: 50 mL
  Filled 2023-05-17: qty 50

## 2023-05-17 NOTE — Anesthesia Postprocedure Evaluation (Signed)
 Anesthesia Post Note  Patient: Madeline Cooper  Procedure(s) Performed: AN AD HOC LABOR EPIDURAL     Patient location during evaluation: Mother Baby Anesthesia Type: Epidural Level of consciousness: awake and alert Pain management: pain level controlled Vital Signs Assessment: post-procedure vital signs reviewed and stable Respiratory status: spontaneous breathing, nonlabored ventilation and respiratory function stable Cardiovascular status: stable Postop Assessment: no headache, no backache, epidural receding, no apparent nausea or vomiting, patient able to bend at knees, able to ambulate and adequate PO intake Anesthetic complications: no   No notable events documented.  Last Vitals:  Vitals:   05/17/23 1250 05/17/23 1349  BP: 113/62 130/69  Pulse: 85 80  Resp: 16 16  Temp: 37.3 C 37.7 C  SpO2: 98% 97%    Last Pain:  Vitals:   05/17/23 1349  TempSrc: Oral  PainSc: 0-No pain   Pain Goal:                Epidural/Spinal Function Cutaneous sensation: Normal sensation (05/17/23 1349), Patient able to flex knees: Yes (05/17/23 1349), Patient able to lift hips off bed: Yes (05/17/23 1349), Back pain beyond tenderness at insertion site: No (05/17/23 1349), Progressively worsening motor and/or sensory loss: No (05/17/23 1349), Bowel and/or bladder incontinence post epidural: No (05/17/23 1349)  [name]

## 2023-05-17 NOTE — Progress Notes (Signed)
 Labor Progress Note Veleda Brynnan Mazyck is a 33 y.o. G3P2002 at [redacted]w[redacted]d presented for IOL due to A1GDM  S: Feeling more pressure   O:  BP 119/74   Pulse 77   Temp 97.7 F (36.5 C) (Oral)   Resp 15   Ht 5\' 4"  (1.626 m)   Wt 101.5 kg   LMP 08/21/2022 (Exact Date)   SpO2 99%   BMI 38.40 kg/m  EFM: 135/mod/+a/-d  CVE: Dilation: 7 Effacement (%): 80 Cervical Position: Middle Station: -2, -1 Presentation: Vertex Exam by:: Lory Rough, RN   A&P: 33 y.o. [id number] [redacted]w[redacted]d here for IOL due to A1GDM #Labor: Progressing well. Anticipate SVD soon. #Pain: Epidural #FWB: Cat I  #GBS negative  #A1GDM: CBG ok, just had hypoglycemic event, getting juice for CBG of 67  #LGA  hx shoulder dystocia: EFW 3849g (>99%tile), AC>99%tile at [redacted]w[redacted]d, pelvis proven to 3700g  SD precautions in place  Melanie Spires, MD 9:08 AM

## 2023-05-17 NOTE — Progress Notes (Signed)
 Hypoglycemic Event  CBG: 62  Treatment: 8 oz juice/soda and strawberry italian ice  Symptoms: None  Follow-up CBG: XBMW:4132 CBG Result:84  Possible Reasons for Event: Inadequate meal intake  Comments/MD notified: Dr. Cooper Denver notified    Adan Adas D Geralyn Knee

## 2023-05-17 NOTE — Progress Notes (Signed)
 Hypoglycemic Event  CBG: 62  Treatment: 1 tube glucose gel  Symptoms: None  Follow-up CBG: Time:1221 CBG Result:78  Possible Reasons for Event: Inadequate meal intake and Change in activity  Comments/MD notified:Dr. Hubert Madden notified    Madeline Cooper

## 2023-05-17 NOTE — Progress Notes (Signed)
 Hypoglycemic Event  CBG: 67  Treatment: 8 oz juice/soda  Symptoms: None  Follow-up CBG: Time:0915 CBG Result:62  Gave D50 25 mL  Follow-up CBG 0935 CBG 81  Possible Reasons for Event: Inadequate meal intake  Comments/MD notified:Dr. Hubert Madden notified in person on unit    Lakesa Coste L Maygen Sirico

## 2023-05-17 NOTE — Lactation Note (Signed)
 This note was copied from a baby's chart. Lactation Consultation Note  Patient Name: Madeline Cooper Nagi [id number] Date: 05/17/2023 Age:32 hours Reason for consult: Initial assessment;Early term 37-38.6wks. Per MOB, she given formula twice due to being tired but plans to latch infant to breast at the next feeding. MOB knows to call for latch assistance if needed for the next feeding, LC written her name on the white board in MOB 's room. MOB feeding choice is breast and bottle feeding. MOB briefly breastfeed her other two children see maternal data below for 2 months each. MOB was set up with DEBP fitted with 24 mm breast flange and MOB was pumping when LC left the room. MOB understands that her EBM is safe for 4 hours whereas formula is only safe for 1 hour at room temperature. MOB will continue to breastfeed/ Feed  infant by cues, 8+ times within 24 hours skin to skin. LC discussed the importance of maternal rest, meals and hydration. MOB was  made aware of O/P services, breastfeeding support groups, community resources, and our phone # for post-discharge questions.    Current feeding plan today. 1- MOB plans to latch infant to breast at the next feeding and will call for latch assistance. 2- MOB plans to continue to supplement infant with each feeding this is her feeding choice. LC gave hand out "Feeding Guidelines", MOB plans to offer 10 mls after infant latches at the breast and if infant does not latch she will offer 10-15 mls or more if infant want it based on hunger cues. 3- MOB plans to continue to use the DEBP every 3 hours for 15 minutes, and will give infant any EBM first before supplementing with formula.  Maternal Data Has patient been taught Hand Expression?: Yes Does the patient have breastfeeding experience prior to this delivery?: Yes How long did the patient breastfeed?: Per MOB, she breastfeed her 57 year old son for 2 months and her 75 month daughter for 2 months  Feeding Mother's  Current Feeding Choice: Breast Milk and Formula Nipple Type: Slow - flow  LATCH Score  LC did not observe latch due infant receiving formula less than 2 hours prior to [facility] entering the room.                  Lactation Tools Discussed/Used Tools: Flanges;Pump Flange Size: 24 Breast pump type: Double-Electric Breast Pump Pump Education: Setup, frequency, and cleaning;Milk Storage Reason for Pumping: MOB is supplementing infant with formula, not latch infant to breast yet but wants to breastfeed infant is 371/7 weeks, ETI infant. Pumping frequency: MOB will continue to use the DEBP every 3 hours for 15 minutes.  Interventions Interventions: Breast feeding basics reviewed;Skin to skin;Hand express;DEBP;Education;Pace feeding;CDC milk storage guidelines;CDC Guidelines for Breast Pump Cleaning;LC Services brochure;Guidelines for Milk Supply and Pumping Schedule Handout  Discharge Pump: DEBP;Personal  Consult Status Consult Status: Follow-up Date: 05/18/23 Follow-up type: In-patient    Pecolia Bourbon 05/17/2023, 4:16 PM

## 2023-05-17 NOTE — Discharge Summary (Signed)
 Postpartum Discharge Summary  Date of Service updated     Patient Name: Madeline Cooper DOB: Apr 24, 1990 MRN: [id number]  Date of admission: 05/16/2023 Delivery date:05/17/2023 Delivering provider: Melanie Spires Date of discharge: 05/18/2023  Admitting diagnosis: Diet controlled gestational diabetes mellitus (GDM) in third trimester [O24.410] Intrauterine pregnancy: [redacted]w[redacted]d     Secondary diagnosis:  Principal Problem:   SVD (spontaneous vaginal delivery) Active Problems:   History of shoulder dystocia in prior pregnancy, currently pregnant   Short interval between pregnancies affecting pregnancy in third trimester, antepartum   BMI 36.0-36.9,adult   Obesity in pregnancy, antepartum, third trimester   Supervision of high risk pregnancy, antepartum   "No results" on Panorama   LGA (large for gestational age) fetus affecting management of mother, third trimester   Diet controlled gestational diabetes mellitus (GDM) in third trimester  Additional problems: None    Discharge diagnosis: Term Pregnancy Delivered and GDM A1                                              Post partum procedures:None Augmentation: AROM, Pitocin , and Cytotec  Complications: None  Hospital course: Induction of Labor With Vaginal Delivery   33 y.o. yo G3P3003 at [redacted]w[redacted]d was admitted to the hospital 05/16/2023 for induction of labor.  Indication for induction: A1 DM.  Patient had an labor course complicated by nothing. Membrane Rupture Time/Date: 11:37 PM,05/16/2023  Delivery Method:Vaginal, Spontaneous Operative Delivery:N/A Episiotomy: None Lacerations:  None Details of delivery can be found in separate delivery note.  Patient had a postpartum course complicated by none. Patient is discharged home 05/18/23.  Newborn Data: Birth date:05/17/2023 Birth time:10:14 AM Gender:Female Living status:Living Apgars:8 ,9  Weight:3700 g  Magnesium  Sulfate received: No BMZ received:  No Rhophylac:N/A MMR:N/A T-DaP:Given prenatally Flu: No RSV Vaccine received: No Transfusion:No  Immunizations received: Immunization History  Administered Date(s) Administered   Influenza,inj,Quad PF,6+ Mos 03/01/2020, 10/13/2020   Tdap 06/22/2020, 07/19/2021, 04/15/2023   Unspecified SARS-COV-2 Vaccination 06/16/2019, 06/30/2019    Physical exam  Vitals:   05/17/23 1349 05/17/23 1746 05/17/23 2000 05/18/23 0539  BP: 130/69 115/65 117/86 121/73  Pulse: 80 64 (!) 55 (!) 52  Resp: 16 16 18 18   Temp: 99.9 F (37.7 C) 98.7 F (37.1 C) 98.3 F (36.8 C) 98.4 F (36.9 C)  TempSrc: Oral Oral Oral Oral  SpO2: 97% 98% 99%   Weight:      Height:       General: alert, cooperative, and no distress Lochia: appropriate Uterine Fundus: firm Incision: N/A DVT Evaluation: No evidence of DVT seen on physical exam. Labs: Lab Results  Component Value Date   WBC 9.5 05/16/2023   HGB 12.4 05/16/2023   HCT 37.8 05/16/2023   MCV 89.6 05/16/2023   PLT 258 05/16/2023      Latest Ref Rng & Units 08/23/2021    4:43 PM  CMP  Glucose 70 - 99 mg/dL 89   BUN 6 - 20 mg/dL 6   Creatinine 2.13 - 0.86 mg/dL 5.78   Sodium 469 - 629 mmol/L 142   Potassium 3.5 - 5.2 mmol/L 3.9   Chloride 96 - 106 mmol/L 107   CO2 20 - 29 mmol/L 18   Calcium  8.7 - 10.2 mg/dL 9.7   Total Protein 6.0 - 8.5 g/dL 6.2   Total Bilirubin 0.0 - 1.2 mg/dL <5.2  Alkaline Phos 44 - 121 IU/L 59   AST 0 - 40 IU/L 11   ALT 0 - 32 IU/L 5    Edinburgh Score:    05/18/2023   11:00 AM  Edinburgh Postnatal Depression Scale Screening Tool  I have been able to laugh and see the funny side of things. 0  I have looked forward with enjoyment to things. 0  I have blamed myself unnecessarily when things went wrong. 0  I have been anxious or worried for no good reason. 0  I have felt scared or panicky for no good reason. 0  Things have been getting on top of me. 0  I have been so unhappy that I have had difficulty sleeping. 0   I have felt sad or miserable. 0  I have been so unhappy that I have been crying. 0  The thought of harming myself has occurred to me. 0  Edinburgh Postnatal Depression Scale Total 0   Edinburgh Postnatal Depression Scale Total: 0   After visit meds:  Allergies as of 05/18/2023   No Known Allergies      Medication List     STOP taking these medications    Accu-Chek Guide Test test strip Generic drug: glucose blood   Accu-Chek Guide w/Device Kit   Accu-Chek Softclix Lancets lancets   aspirin  EC 81 MG tablet   glucose blood test strip       TAKE these medications    Vitafol  Ultra 29-0.6-0.4-200 MG Caps Take 1 capsule by mouth daily.         Discharge home in stable condition Infant Feeding: Bottle and Breast Infant Disposition:home with mother Discharge instruction: per After Visit Summary and Postpartum booklet. Activity: Advance as tolerated. Pelvic rest for 6 weeks.  Diet: routine diet Future Appointments:No future appointments. Follow up Visit: Message sent to [facility] 5/16  Please schedule this patient for a In person postpartum visit in 6 weeks with the following provider: Any provider. Additional Postpartum F/U:2 hour GTT  High risk pregnancy complicated by: GDM Delivery mode:  Vaginal, Spontaneous Anticipated Birth Control:  Natural Family Planning   05/18/2023 Maud Sorenson, MD

## 2023-05-17 NOTE — Progress Notes (Signed)
 LABOR PROGRESS NOTE  Patient Name: Madeline Cooper, female   DOB: 07-07-1990, 33 y.o.  MRN: [id number]  Pt comfortable with epidural. Very agreeable to regular position changes. Discussed risks, benefits, and indications for AROM; pt agreeable. Desires efficient induction course.   Blood pressure 114/74, pulse (!) 59, temperature 98.6 F (37 C), temperature source Axillary, resp. rate 16, height 5\' 4"  (1.626 m), weight 101.5 kg, last menstrual period 08/21/2022, SpO2 99%, not currently breastfeeding.  CBG (last 3)  Recent Labs    05/16/23 1419 05/16/23 1758 05/16/23 2201  GLUCAP 83 88 82   EFM: baseline 150, accels, no decels, moderate variability TOCO: no contractions  Dilation: 5 Effacement (%): 70 Cervical Position: Middle Station: -3 Presentation: Vertex Exam by:: Dr. Esai Stecklein  Vertex, well applied. AROM scant clear. Continue to titrate pitocin  2x2 to adequate contractions.  Reassess in 4-6 hours or sooner for clinical change/pt request or fetal indication.  Anticipate vaginal delivery  Elyza Whitt, MD

## 2023-05-18 ENCOUNTER — Other Ambulatory Visit: Payer: Self-pay

## 2023-05-18 NOTE — Plan of Care (Signed)
 Problem: Education: Goal: Knowledge of General Education information will improve Description: Including pain rating scale, medication(s)/side effects and non-pharmacologic comfort measures 05/18/2023 1014 by Osvaldo Blender, LPN Outcome: Adequate for Discharge 05/18/2023 0704 by Osvaldo Blender, LPN Outcome: Progressing 05/18/2023 0704 by Osvaldo Blender, LPN Outcome: Progressing   Problem: Health Behavior/Discharge Planning: Goal: Ability to manage health-related needs will improve 05/18/2023 1014 by Osvaldo Blender, LPN Outcome: Adequate for Discharge 05/18/2023 0704 by Osvaldo Blender, LPN Outcome: Progressing 05/18/2023 0704 by Osvaldo Blender, LPN Outcome: Progressing   Problem: Clinical Measurements: Goal: Ability to maintain clinical measurements within normal limits will improve 05/18/2023 1014 by Osvaldo Blender, LPN Outcome: Adequate for Discharge 05/18/2023 0704 by Osvaldo Blender, LPN Outcome: Progressing 05/18/2023 0704 by Osvaldo Blender, LPN Outcome: Progressing Goal: Will remain free from infection 05/18/2023 1014 by Osvaldo Blender, LPN Outcome: Adequate for Discharge 05/18/2023 0704 by Osvaldo Blender, LPN Outcome: Progressing 05/18/2023 0704 by Osvaldo Blender, LPN Outcome: Progressing Goal: Diagnostic test results will improve 05/18/2023 1014 by Osvaldo Blender, LPN Outcome: Adequate for Discharge 05/18/2023 0704 by Osvaldo Blender, LPN Outcome: Progressing 05/18/2023 0704 by Osvaldo Blender, LPN Outcome: Progressing Goal: Respiratory complications will improve 05/18/2023 1014 by Osvaldo Blender, LPN Outcome: Adequate for Discharge 05/18/2023 0704 by Osvaldo Blender, LPN Outcome: Progressing 05/18/2023 0704 by Osvaldo Blender, LPN Outcome: Progressing Goal: Cardiovascular complication will be avoided 05/18/2023 1014 by Osvaldo Blender, LPN Outcome: Adequate for Discharge 05/18/2023 0704 by Osvaldo Blender, LPN Outcome: Progressing 05/18/2023 0704 by Osvaldo Blender, LPN Outcome:  Progressing   Problem: Activity: Goal: Risk for activity intolerance will decrease 05/18/2023 1014 by Osvaldo Blender, LPN Outcome: Adequate for Discharge 05/18/2023 0704 by Osvaldo Blender, LPN Outcome: Progressing 05/18/2023 0704 by Osvaldo Blender, LPN Outcome: Progressing   Problem: Nutrition: Goal: Adequate nutrition will be maintained 05/18/2023 1014 by Osvaldo Blender, LPN Outcome: Adequate for Discharge 05/18/2023 0704 by Osvaldo Blender, LPN Outcome: Progressing 05/18/2023 0704 by Osvaldo Blender, LPN Outcome: Progressing   Problem: Coping: Goal: Level of anxiety will decrease 05/18/2023 1014 by Osvaldo Blender, LPN Outcome: Adequate for Discharge 05/18/2023 0704 by Osvaldo Blender, LPN Outcome: Progressing 05/18/2023 0704 by Osvaldo Blender, LPN Outcome: Progressing   Problem: Elimination: Goal: Will not experience complications related to bowel motility 05/18/2023 1014 by Osvaldo Blender, LPN Outcome: Adequate for Discharge 05/18/2023 0704 by Osvaldo Blender, LPN Outcome: Progressing 05/18/2023 0704 by Osvaldo Blender, LPN Outcome: Progressing Goal: Will not experience complications related to urinary retention 05/18/2023 1014 by Osvaldo Blender, LPN Outcome: Adequate for Discharge 05/18/2023 0704 by Osvaldo Blender, LPN Outcome: Progressing 05/18/2023 0704 by Osvaldo Blender, LPN Outcome: Progressing   Problem: Pain Managment: Goal: General experience of comfort will improve and/or be controlled 05/18/2023 1014 by Osvaldo Blender, LPN Outcome: Adequate for Discharge 05/18/2023 0704 by Osvaldo Blender, LPN Outcome: Progressing 05/18/2023 0704 by Osvaldo Blender, LPN Outcome: Progressing   Problem: Safety: Goal: Ability to remain free from injury will improve 05/18/2023 1014 by Osvaldo Blender, LPN Outcome: Adequate for Discharge 05/18/2023 0704 by Osvaldo Blender, LPN Outcome: Progressing 05/18/2023 0704 by Osvaldo Blender, LPN Outcome: Progressing   Problem: Skin Integrity: Goal:  Risk for impaired skin integrity will decrease 05/18/2023 1014 by Osvaldo Blender, LPN Outcome: Adequate for Discharge 05/18/2023 0704 by Osvaldo Blender, LPN Outcome: Progressing 05/18/2023 0704 by Allyn Ivy  Rhesa Celeste, LPN Outcome: Progressing   Problem: Education: Goal: Knowledge of Childbirth will improve 05/18/2023 1014 by Osvaldo Blender, LPN Outcome: Adequate for Discharge 05/18/2023 0704 by Osvaldo Blender, LPN Outcome: Progressing 05/18/2023 0704 by Osvaldo Blender, LPN Outcome: Progressing Goal: Ability to make informed decisions regarding treatment and plan of care will improve 05/18/2023 1014 by Osvaldo Blender, LPN Outcome: Adequate for Discharge 05/18/2023 0704 by Osvaldo Blender, LPN Outcome: Progressing 05/18/2023 0704 by Osvaldo Blender, LPN Outcome: Progressing Goal: Ability to state and carry out methods to decrease the pain will improve 05/18/2023 1014 by Osvaldo Blender, LPN Outcome: Adequate for Discharge 05/18/2023 0704 by Osvaldo Blender, LPN Outcome: Progressing 05/18/2023 0704 by Osvaldo Blender, LPN Outcome: Progressing Goal: Individualized Educational Video(s) 05/18/2023 1014 by Osvaldo Blender, LPN Outcome: Adequate for Discharge 05/18/2023 0704 by Osvaldo Blender, LPN Outcome: Progressing 05/18/2023 0704 by Osvaldo Blender, LPN Outcome: Progressing   Problem: Coping: Goal: Ability to verbalize concerns and feelings about labor and delivery will improve 05/18/2023 1014 by Osvaldo Blender, LPN Outcome: Adequate for Discharge 05/18/2023 0704 by Osvaldo Blender, LPN Outcome: Progressing 05/18/2023 0704 by Osvaldo Blender, LPN Outcome: Progressing   Problem: Life Cycle: Goal: Ability to make normal progression through stages of labor will improve 05/18/2023 1014 by Osvaldo Blender, LPN Outcome: Adequate for Discharge 05/18/2023 0704 by Osvaldo Blender, LPN Outcome: Progressing 05/18/2023 0704 by Osvaldo Blender, LPN Outcome: Progressing Goal: Ability to effectively push during  vaginal delivery will improve 05/18/2023 1014 by Osvaldo Blender, LPN Outcome: Adequate for Discharge 05/18/2023 0704 by Osvaldo Blender, LPN Outcome: Progressing 05/18/2023 0704 by Osvaldo Blender, LPN Outcome: Progressing   Problem: Role Relationship: Goal: Will demonstrate positive interactions with the child 05/18/2023 1014 by Osvaldo Blender, LPN Outcome: Adequate for Discharge 05/18/2023 0704 by Osvaldo Blender, LPN Outcome: Progressing 05/18/2023 0704 by Osvaldo Blender, LPN Outcome: Progressing   Problem: Safety: Goal: Risk of complications during labor and delivery will decrease 05/18/2023 1014 by Osvaldo Blender, LPN Outcome: Adequate for Discharge 05/18/2023 0704 by Osvaldo Blender, LPN Outcome: Progressing 05/18/2023 0704 by Osvaldo Blender, LPN Outcome: Progressing   Problem: Pain Management: Goal: Relief or control of pain from uterine contractions will improve 05/18/2023 1014 by Osvaldo Blender, LPN Outcome: Adequate for Discharge 05/18/2023 0704 by Osvaldo Blender, LPN Outcome: Progressing 05/18/2023 0704 by Osvaldo Blender, LPN Outcome: Progressing   Problem: Education: Goal: Knowledge of condition will improve 05/18/2023 1014 by Osvaldo Blender, LPN Outcome: Adequate for Discharge 05/18/2023 0704 by Osvaldo Blender, LPN Outcome: Progressing 05/18/2023 0704 by Osvaldo Blender, LPN Outcome: Progressing Goal: Individualized Educational Video(s) 05/18/2023 1014 by Osvaldo Blender, LPN Outcome: Adequate for Discharge 05/18/2023 0704 by Osvaldo Blender, LPN Outcome: Progressing 05/18/2023 0704 by Osvaldo Blender, LPN Outcome: Progressing Goal: Individualized Newborn Educational Video(s) 05/18/2023 1014 by Osvaldo Blender, LPN Outcome: Adequate for Discharge 05/18/2023 0704 by Osvaldo Blender, LPN Outcome: Progressing 05/18/2023 0704 by Osvaldo Blender, LPN Outcome: Progressing   Problem: Activity: Goal: Will verbalize the importance of balancing activity with adequate rest  periods 05/18/2023 1014 by Osvaldo Blender, LPN Outcome: Adequate for Discharge 05/18/2023 0704 by Osvaldo Blender, LPN Outcome: Progressing 05/18/2023 0704 by Osvaldo Blender, LPN Outcome: Progressing Goal: Ability to tolerate increased activity will improve 05/18/2023 1014 by Osvaldo Blender, LPN Outcome: Adequate for Discharge 05/18/2023 0704 by Allyn Ivy,  Rhesa Celeste, LPN Outcome: Progressing 05/18/2023 0704 by Osvaldo Blender, LPN Outcome: Progressing   Problem: Coping: Goal: Ability to identify and utilize available resources and services will improve 05/18/2023 1014 by Osvaldo Blender, LPN Outcome: Adequate for Discharge 05/18/2023 0704 by Osvaldo Blender, LPN Outcome: Progressing 05/18/2023 0704 by Osvaldo Blender, LPN Outcome: Progressing   Problem: Life Cycle: Goal: Chance of risk for complications during the postpartum period will decrease 05/18/2023 1014 by Osvaldo Blender, LPN Outcome: Adequate for Discharge 05/18/2023 0704 by Osvaldo Blender, LPN Outcome: Progressing 05/18/2023 0704 by Osvaldo Blender, LPN Outcome: Progressing   Problem: Role Relationship: Goal: Ability to demonstrate positive interaction with newborn will improve 05/18/2023 1014 by Osvaldo Blender, LPN Outcome: Adequate for Discharge 05/18/2023 0704 by Osvaldo Blender, LPN Outcome: Progressing 05/18/2023 0704 by Osvaldo Blender, LPN Outcome: Progressing   Problem: Skin Integrity: Goal: Demonstration of wound healing without infection will improve 05/18/2023 1014 by Osvaldo Blender, LPN Outcome: Adequate for Discharge 05/18/2023 0704 by Osvaldo Blender, LPN Outcome: Progressing 05/18/2023 0704 by Osvaldo Blender, LPN Outcome: Progressing

## 2023-05-18 NOTE — Discharge Instructions (Signed)

## 2023-05-18 NOTE — Plan of Care (Signed)
 Problem: Education: Goal: Knowledge of General Education information will improve Description: Including pain rating scale, medication(s)/side effects and non-pharmacologic comfort measures 05/18/2023 0704 by Osvaldo Blender, LPN Outcome: Progressing 05/18/2023 0704 by Osvaldo Blender, LPN Outcome: Progressing   Problem: Health Behavior/Discharge Planning: Goal: Ability to manage health-related needs will improve 05/18/2023 0704 by Osvaldo Blender, LPN Outcome: Progressing 05/18/2023 0704 by Osvaldo Blender, LPN Outcome: Progressing   Problem: Clinical Measurements: Goal: Ability to maintain clinical measurements within normal limits will improve 05/18/2023 0704 by Osvaldo Blender, LPN Outcome: Progressing 05/18/2023 0704 by Osvaldo Blender, LPN Outcome: Progressing Goal: Will remain free from infection 05/18/2023 0704 by Osvaldo Blender, LPN Outcome: Progressing 05/18/2023 0704 by Osvaldo Blender, LPN Outcome: Progressing Goal: Diagnostic test results will improve 05/18/2023 0704 by Osvaldo Blender, LPN Outcome: Progressing 05/18/2023 0704 by Osvaldo Blender, LPN Outcome: Progressing Goal: Respiratory complications will improve 05/18/2023 0704 by Osvaldo Blender, LPN Outcome: Progressing 05/18/2023 0704 by Osvaldo Blender, LPN Outcome: Progressing Goal: Cardiovascular complication will be avoided 05/18/2023 0704 by Osvaldo Blender, LPN Outcome: Progressing 05/18/2023 0704 by Osvaldo Blender, LPN Outcome: Progressing   Problem: Activity: Goal: Risk for activity intolerance will decrease 05/18/2023 0704 by Osvaldo Blender, LPN Outcome: Progressing 05/18/2023 0704 by Osvaldo Blender, LPN Outcome: Progressing   Problem: Nutrition: Goal: Adequate nutrition will be maintained 05/18/2023 0704 by Osvaldo Blender, LPN Outcome: Progressing 05/18/2023 0704 by Osvaldo Blender, LPN Outcome: Progressing   Problem: Coping: Goal: Level of anxiety will decrease 05/18/2023 0704 by Osvaldo Blender, LPN Outcome:  Progressing 05/18/2023 0704 by Osvaldo Blender, LPN Outcome: Progressing   Problem: Elimination: Goal: Will not experience complications related to bowel motility 05/18/2023 0704 by Osvaldo Blender, LPN Outcome: Progressing 05/18/2023 0704 by Osvaldo Blender, LPN Outcome: Progressing Goal: Will not experience complications related to urinary retention 05/18/2023 0704 by Osvaldo Blender, LPN Outcome: Progressing 05/18/2023 0704 by Osvaldo Blender, LPN Outcome: Progressing   Problem: Pain Managment: Goal: General experience of comfort will improve and/or be controlled 05/18/2023 0704 by Osvaldo Blender, LPN Outcome: Progressing 05/18/2023 0704 by Osvaldo Blender, LPN Outcome: Progressing   Problem: Safety: Goal: Ability to remain free from injury will improve 05/18/2023 0704 by Osvaldo Blender, LPN Outcome: Progressing 05/18/2023 0704 by Osvaldo Blender, LPN Outcome: Progressing   Problem: Skin Integrity: Goal: Risk for impaired skin integrity will decrease 05/18/2023 0704 by Osvaldo Blender, LPN Outcome: Progressing 05/18/2023 0704 by Osvaldo Blender, LPN Outcome: Progressing   Problem: Education: Goal: Knowledge of Childbirth will improve 05/18/2023 0704 by Osvaldo Blender, LPN Outcome: Progressing 05/18/2023 0704 by Osvaldo Blender, LPN Outcome: Progressing Goal: Ability to make informed decisions regarding treatment and plan of care will improve 05/18/2023 0704 by Osvaldo Blender, LPN Outcome: Progressing 05/18/2023 0704 by Osvaldo Blender, LPN Outcome: Progressing Goal: Ability to state and carry out methods to decrease the pain will improve 05/18/2023 0704 by Osvaldo Blender, LPN Outcome: Progressing 05/18/2023 0704 by Osvaldo Blender, LPN Outcome: Progressing Goal: Individualized Educational Video(s) 05/18/2023 0704 by Osvaldo Blender, LPN Outcome: Progressing 05/18/2023 0704 by Osvaldo Blender, LPN Outcome: Progressing   Problem: Coping: Goal: Ability to verbalize concerns and feelings  about labor and delivery will improve 05/18/2023 0704 by Osvaldo Blender, LPN Outcome: Progressing 05/18/2023 0704 by Osvaldo Blender, LPN Outcome: Progressing   Problem: Life Cycle: Goal: Ability to make normal progression  through stages of labor will improve 05/18/2023 0704 by Osvaldo Blender, LPN Outcome: Progressing 05/18/2023 0704 by Osvaldo Blender, LPN Outcome: Progressing Goal: Ability to effectively push during vaginal delivery will improve 05/18/2023 0704 by Osvaldo Blender, LPN Outcome: Progressing 05/18/2023 0704 by Osvaldo Blender, LPN Outcome: Progressing   Problem: Role Relationship: Goal: Will demonstrate positive interactions with the child 05/18/2023 0704 by Osvaldo Blender, LPN Outcome: Progressing 05/18/2023 0704 by Osvaldo Blender, LPN Outcome: Progressing   Problem: Safety: Goal: Risk of complications during labor and delivery will decrease 05/18/2023 0704 by Osvaldo Blender, LPN Outcome: Progressing 05/18/2023 0704 by Osvaldo Blender, LPN Outcome: Progressing   Problem: Pain Management: Goal: Relief or control of pain from uterine contractions will improve 05/18/2023 0704 by Osvaldo Blender, LPN Outcome: Progressing 05/18/2023 0704 by Osvaldo Blender, LPN Outcome: Progressing   Problem: Education: Goal: Knowledge of condition will improve 05/18/2023 0704 by Osvaldo Blender, LPN Outcome: Progressing 05/18/2023 0704 by Osvaldo Blender, LPN Outcome: Progressing Goal: Individualized Educational Video(s) 05/18/2023 0704 by Osvaldo Blender, LPN Outcome: Progressing 05/18/2023 0704 by Osvaldo Blender, LPN Outcome: Progressing Goal: Individualized Newborn Educational Video(s) 05/18/2023 0704 by Osvaldo Blender, LPN Outcome: Progressing 05/18/2023 0704 by Osvaldo Blender, LPN Outcome: Progressing   Problem: Activity: Goal: Will verbalize the importance of balancing activity with adequate rest periods 05/18/2023 0704 by Osvaldo Blender, LPN Outcome: Progressing 05/18/2023 0704 by  Osvaldo Blender, LPN Outcome: Progressing Goal: Ability to tolerate increased activity will improve 05/18/2023 0704 by Osvaldo Blender, LPN Outcome: Progressing 05/18/2023 0704 by Osvaldo Blender, LPN Outcome: Progressing   Problem: Coping: Goal: Ability to identify and utilize available resources and services will improve 05/18/2023 0704 by Osvaldo Blender, LPN Outcome: Progressing 05/18/2023 0704 by Osvaldo Blender, LPN Outcome: Progressing   Problem: Life Cycle: Goal: Chance of risk for complications during the postpartum period will decrease 05/18/2023 0704 by Osvaldo Blender, LPN Outcome: Progressing 05/18/2023 0704 by Osvaldo Blender, LPN Outcome: Progressing   Problem: Role Relationship: Goal: Ability to demonstrate positive interaction with newborn will improve 05/18/2023 0704 by Osvaldo Blender, LPN Outcome: Progressing 05/18/2023 0704 by Osvaldo Blender, LPN Outcome: Progressing   Problem: Skin Integrity: Goal: Demonstration of wound healing without infection will improve 05/18/2023 0704 by Osvaldo Blender, LPN Outcome: Progressing 05/18/2023 0704 by Osvaldo Blender, LPN Outcome: Progressing

## 2023-05-20 LAB — GLUCOSE, CAPILLARY: Glucose-Capillary: 79 mg/dL (ref 70–99)

## 2023-05-27 ENCOUNTER — Encounter (HOSPITAL_COMMUNITY): Payer: Self-pay | Admitting: Family Medicine

## 2023-05-27 ENCOUNTER — Other Ambulatory Visit: Payer: Self-pay

## 2023-05-27 ENCOUNTER — Inpatient Hospital Stay (HOSPITAL_COMMUNITY)
Admission: AD | Admit: 2023-05-27 | Discharge: 2023-05-29 | DRG: 776 | Disposition: A | Discharge status: Home / Self Care | Attending: Family Medicine | Admitting: Family Medicine

## 2023-05-27 DIAGNOSIS — O864 Pyrexia of unknown origin following delivery: Secondary | ICD-10-CM | POA: Diagnosis present

## 2023-05-27 DIAGNOSIS — O8612 Endometritis following delivery: Secondary | ICD-10-CM | POA: Diagnosis present

## 2023-05-27 LAB — URINALYSIS, ROUTINE W REFLEX MICROSCOPIC
Bacteria, UA: NONE SEEN
Bilirubin Urine: NEGATIVE
Glucose, UA: NEGATIVE mg/dL
Ketones, ur: NEGATIVE mg/dL
Nitrite: NEGATIVE
Protein, ur: 100 mg/dL — AB
Specific Gravity, Urine: 1.023 (ref 1.005–1.030)
pH: 6 (ref 5.0–8.0)

## 2023-05-27 LAB — CBC WITH DIFFERENTIAL/PLATELET
Abs Immature Granulocytes: 0.04 10*3/uL (ref 0.00–0.07)
Basophils Absolute: 0.1 10*3/uL (ref 0.0–0.1)
Basophils Relative: 0 %
Eosinophils Absolute: 0 10*3/uL (ref 0.0–0.5)
Eosinophils Relative: 0 %
HCT: 43 % (ref 36.0–46.0)
Hemoglobin: 14.4 g/dL (ref 12.0–15.0)
Immature Granulocytes: 0 %
Lymphocytes Relative: 13 %
Lymphs Abs: 1.9 10*3/uL (ref 0.7–4.0)
MCH: 30 pg (ref 26.0–34.0)
MCHC: 33.5 g/dL (ref 30.0–36.0)
MCV: 89.6 fL (ref 80.0–100.0)
Monocytes Absolute: 1.1 10*3/uL — ABNORMAL HIGH (ref 0.1–1.0)
Monocytes Relative: 7 %
Neutro Abs: 11.8 10*3/uL — ABNORMAL HIGH (ref 1.7–7.7)
Neutrophils Relative %: 80 %
Platelets: 274 10*3/uL (ref 150–400)
RBC: 4.8 MIL/uL (ref 3.87–5.11)
RDW: 13.9 % (ref 11.5–15.5)
WBC: 14.9 10*3/uL — ABNORMAL HIGH (ref 4.0–10.5)
nRBC: 0 % (ref 0.0–0.2)

## 2023-05-27 LAB — COMPREHENSIVE METABOLIC PANEL WITH GFR
ALT: 16 U/L (ref 0–44)
AST: 15 U/L (ref 15–41)
Albumin: 3.3 g/dL — ABNORMAL LOW (ref 3.5–5.0)
Alkaline Phosphatase: 55 U/L (ref 38–126)
Anion gap: 9 (ref 5–15)
BUN: 13 mg/dL (ref 6–20)
CO2: 16 mmol/L — ABNORMAL LOW (ref 22–32)
Calcium: 8.1 mg/dL — ABNORMAL LOW (ref 8.9–10.3)
Chloride: 111 mmol/L (ref 98–111)
Creatinine, Ser: 0.79 mg/dL (ref 0.44–1.00)
GFR, Estimated: >60 mL/min (ref >60)
Glucose, Bld: 102 mg/dL — ABNORMAL HIGH (ref 70–99)
Potassium: 3.3 mmol/L — ABNORMAL LOW (ref 3.5–5.1)
Sodium: 136 mmol/L (ref 135–145)
Total Bilirubin: 0.8 mg/dL (ref 0.0–1.2)
Total Protein: 7.1 g/dL (ref 6.5–8.1)

## 2023-05-27 LAB — LACTIC ACID, PLASMA: Lactic Acid, Venous: 0.9 mmol/L (ref 0.5–1.9)

## 2023-05-27 MED ORDER — OXYCODONE HCL 5 MG PO TABS: 10.0000 mg | ORAL_TABLET | ORAL | Status: DC | PRN

## 2023-05-27 MED ORDER — DIPHENHYDRAMINE HCL 25 MG PO CAPS: 25.0000 mg | ORAL_CAPSULE | Freq: Four times a day (QID) | ORAL | Status: DC | PRN

## 2023-05-27 MED ORDER — IBUPROFEN 600 MG PO TABS
600.0000 mg | ORAL_TABLET | Freq: Four times a day (QID) | ORAL | Status: DC
  Administered 2023-05-27 – 2023-05-28 (×4): 600 mg via ORAL
  Filled 2023-05-27 (×4): qty 1

## 2023-05-27 MED ORDER — CLINDAMYCIN PHOSPHATE 900 MG/50ML IV SOLN
900.0000 mg | Freq: Three times a day (TID) | INTRAVENOUS | Status: DC
  Administered 2023-05-27 – 2023-05-29 (×6): 900 mg via INTRAVENOUS
  Filled 2023-05-27 (×7): qty 50

## 2023-05-27 MED ORDER — ACETAMINOPHEN 325 MG PO TABS
650.0000 mg | ORAL_TABLET | ORAL | Status: DC | PRN
  Administered 2023-05-27: 650 mg via ORAL
  Filled 2023-05-27 (×2): qty 2

## 2023-05-27 MED ORDER — DIBUCAINE (PERIANAL) 1 % EX OINT: 1.0000 | TOPICAL_OINTMENT | CUTANEOUS | Status: DC | PRN

## 2023-05-27 MED ORDER — LACTATED RINGERS IV SOLN: INTRAVENOUS | Status: AC

## 2023-05-27 MED ORDER — WITCH HAZEL-GLYCERIN EX PADS: 1.0000 | MEDICATED_PAD | CUTANEOUS | Status: DC | PRN

## 2023-05-27 MED ORDER — OXYCODONE HCL 5 MG PO TABS: 5.0000 mg | ORAL_TABLET | ORAL | Status: DC | PRN

## 2023-05-27 MED ORDER — SENNOSIDES-DOCUSATE SODIUM 8.6-50 MG PO TABS
2.0000 | ORAL_TABLET | Freq: Every day | ORAL | Status: DC
  Administered 2023-05-28: 2 via ORAL
  Filled 2023-05-27: qty 2

## 2023-05-27 MED ORDER — ONDANSETRON HCL 4 MG PO TABS: 4.0000 mg | ORAL_TABLET | ORAL | Status: DC | PRN

## 2023-05-27 MED ORDER — COCONUT OIL OIL: 1.0000 | TOPICAL_OIL | Status: DC | PRN

## 2023-05-27 MED ORDER — LACTATED RINGERS IV SOLN
INTRAVENOUS | Status: AC
Start: 2023-05-27 — End: 2023-05-28

## 2023-05-27 MED ORDER — TETANUS-DIPHTH-ACELL PERTUSSIS 5-2.5-18.5 LF-MCG/0.5 IM SUSY: 0.5000 mL | PREFILLED_SYRINGE | Freq: Once | INTRAMUSCULAR | Status: DC

## 2023-05-27 MED ORDER — GENTAMICIN SULFATE 40 MG/ML IJ SOLN
5.0000 mg/kg | INTRAVENOUS | Status: DC
  Administered 2023-05-27 – 2023-05-28 (×2): 370 mg via INTRAVENOUS
  Filled 2023-05-27 (×3): qty 9.25

## 2023-05-27 MED ORDER — ONDANSETRON HCL 4 MG/2ML IJ SOLN: 4.0000 mg | INTRAMUSCULAR | Status: DC | PRN

## 2023-05-27 MED ORDER — ZOLPIDEM TARTRATE 5 MG PO TABS: 5.0000 mg | ORAL_TABLET | Freq: Every evening | ORAL | Status: DC | PRN

## 2023-05-27 MED ORDER — PRENATAL MULTIVITAMIN CH
1.0000 | ORAL_TABLET | Freq: Every day | ORAL | Status: DC
  Administered 2023-05-28: 1 via ORAL
  Filled 2023-05-27: qty 1

## 2023-05-27 MED ORDER — SIMETHICONE 80 MG PO CHEW: 80.0000 mg | CHEWABLE_TABLET | ORAL | Status: DC | PRN

## 2023-05-27 MED ORDER — BENZOCAINE-MENTHOL 20-0.5 % EX AERO: 1.0000 | INHALATION_SPRAY | CUTANEOUS | Status: DC | PRN

## 2023-05-27 NOTE — H&P (Signed)
 None     S [name] is a 33 y.o. [phone] postpartum female at [redacted]w[redacted]d who presents to [facility] today with complaint of fever since yesterday, T max 102. Took Tylenol  at home. She reports cramping "that doubles me over" at times. She reports uterine tenderness. She reports foul discharge several days ago that has since resolved. She reports appropriate lochia. She denies complaints with her breasts.   Receives care at [facility]. EMR reviewed  Pertinent items noted in HPI and remainder of comprehensive ROS otherwise negative.   O BP 101/70 (BP Location: Right Arm)   Pulse (!) 104   Temp (!) 100.6 F (38.1 C) (Oral)   Resp 16   SpO2 100%   Breastfeeding Yes  Physical Exam Vitals reviewed. Exam conducted with a chaperone present.  Constitutional:      General: She is not in acute distress.    Appearance: Normal appearance. She is well-developed. She is ill-appearing. She is not toxic-appearing.  HENT:     Head: Normocephalic.  Cardiovascular:     Rate and Rhythm: Regular rhythm. Tachycardia present.     Pulses: Normal pulses.     Heart sounds: Normal heart sounds.  Pulmonary:     Effort: Pulmonary effort is normal.     Breath sounds: Normal breath sounds.  Abdominal:     General: Abdomen is flat.     Tenderness: There is abdominal tenderness in the suprapubic area.    Genitourinary:    Comments: Lochia appropriate and non-foul smelling.  Skin:    General: Skin is warm and dry.     Capillary Refill: Capillary refill takes less than 2 seconds.  Neurological:     General: No focal deficit present.     Mental Status: She is alert and oriented to person, place, and time.  Psychiatric:        Mood and Affect: Mood normal.        Behavior: Behavior normal.      MDM: Likely postpartum endometritis based on clinical presentation. Consulted Dr. Racheal Cooper for recommendations: Labs to follow: CBC, UA with culture, CMP, Rule out sepsis. : Lactic acid, blood cultures x2  Once blood  cultures drawn and CMP reassuring, the following IV antibiotics were ordered:  - Clindamycin 900 mg every eight hours plus ?Gentamicin 5 mg/kg every 24 hours (preferred) or 1.5 mg/kg every eight hours According to UpToDate -- A response to the initial antibiotic regimen should be evident within 24 to 48 hours. IV treatment is typically continued until the patient is clinically improved (no fundal tenderness) and afebrile for 24 to 48 hours. MAU Course:  A Postpartum endometritis  Medical screening exam complete  P Admission to [facility] for observation and IV antibiotics per Dr. Racheal Cooper' recommendations.  IV antibiotics likely needed for 24-48 hours or until afebrile and without  fundal tenderness for 24-48 hours.  Patient lactating, will need breastpump to express milk on OBSCU. Not currently latching infant.   Madeline Bunk, MSN, CNM 05/27/2023 1:23 PM  Certified Nurse Midwife, [facility]

## 2023-05-27 NOTE — MAU Note (Signed)
..  Madeline Cooper is a 33 y.o. postpartum SVD day 10 here in [facility]U reporting: intermittent lower abdominal cramping she states sometimes doubles her over. Four days ago she noticed some foul smelling vaginal discharge that has since ceased. She also started having a fever and chills that began two days ago. Denies heavy vaginal bleeding. Denies breast complaints. Last took 500mg  of tylenol  an hour ago (1100). Fever reached 102 this morning prior to tylenol . Denies abdominal pain currently.   Pain score: 0 Vitals:   05/27/23 1204  BP: 101/70  Pulse: (!) 104  Resp: 16  Temp: (!) 100.6 F (38.1 C)  SpO2: 100%

## 2023-05-28 LAB — CBC
HCT: 47.5 % — ABNORMAL HIGH (ref 36.0–46.0)
Hemoglobin: 15.6 g/dL — ABNORMAL HIGH (ref 12.0–15.0)
MCH: 30.4 pg (ref 26.0–34.0)
MCHC: 32.8 g/dL (ref 30.0–36.0)
MCV: 92.4 fL (ref 80.0–100.0)
Platelets: 245 10*3/uL (ref 150–400)
RBC: 5.14 MIL/uL — ABNORMAL HIGH (ref 3.87–5.11)
RDW: 14 % (ref 11.5–15.5)
WBC: 11.5 10*3/uL — ABNORMAL HIGH (ref 4.0–10.5)
nRBC: 0 % (ref 0.0–0.2)

## 2023-05-28 LAB — CULTURE, OB URINE

## 2023-05-28 NOTE — Lactation Note (Addendum)
 Lactation Consultation Note  Patient Name: Madeline Cooper [id number] Date: 05/28/2023 Age:33 y.o.    Patient readmitted postpartum with possible endometritis. She is lactating and pumping only. LC visit to support mother with pumping while away from her baby. Mother states she is using her hands free pump. When asked how she feels her milk supply is doing, she responded that "when she was feeling so bad she didn't pump as often and noticed a decrease of about an ounce".   Offered to set up the hospital DEBP due to stronger suction to stimulate and enhance her milk production. Mother politely declined and desires to use her pump, drink more fluids and pump more often. Mother informed she can request pump at any time if she desires.      Consult Status  complete    Esperanza Hedges 05/28/2023, 10:01 AM

## 2023-05-28 NOTE — Progress Notes (Signed)
 Gynecology Progress Note  Admission Date: 05/27/2023 Current Date: 05/28/2023 12:28 PM  Madeline Cooper is a 33 y.o. [id number] HD#2 admitted for postpartum endometritis.   Subjective:  She reports feeling much better. Abdominal pain improved. Denies heavy vaginal bleeding.  Objective:   Patient Vitals for the past 24 hrs:  BP Temp Temp src Pulse Resp SpO2  05/28/23 1220 115/72 97.8 F (36.6 C) Oral 67 17 98 %  05/28/23 0808 (!) 98/59 98.1 F (36.7 C) Oral (!) 59 16 97 %  05/28/23 0341 119/79 97.6 F (36.4 C) Oral (!) 56 17 98 %  05/28/23 0020 (!) 100/53 (!) 97.5 F (36.4 C) Oral (!) 57 19 100 %  05/27/23 2155 -- 99.1 F (37.3 C) Oral -- -- --  05/27/23 2048 -- (!) 100.4 F (38 C) Oral -- -- --  05/27/23 1937 109/68 (!) 100.4 F (38 C) Oral 85 18 98 %  05/27/23 1842 117/69 98.7 F (37.1 C) Oral 81 18 100 %  05/27/23 1446 113/72 99 F (37.2 C) Oral 85 17 100 %    Physical exam: General appearance: alert, cooperative, and appears stated age Abdomen: soft, nontender, nondistended, fundus firm and nontender below umbilicus Perineum: no bleeding noted Extremities:no edema or erythema in bilateral lower extremities  Medications Current Facility-Administered Medications  Medication Dose Route Frequency Provider Last Rate Last Admin   acetaminophen  (TYLENOL ) tablet 650 mg  650 mg Oral Q4H PRN Abigail Abler, MD   650 mg at 05/27/23 2052   benzocaine -Menthol  (DERMOPLAST) 20-0.5 % topical spray 1 Application  1 Application Topical PRN Abigail Abler, MD       clindamycin (CLEOCIN) IVPB 900 mg  900 mg Intravenous Q8H Abigail Abler, MD 100 mL/hr at 05/28/23 0728 900 mg at 05/28/23 9528   coconut oil  1 Application Topical PRN Abigail Abler, MD       witch hazel-glycerin  (TUCKS) pad 1 Application  1 Application Topical PRN Abigail Abler, MD       And   dibucaine (NUPERCAINAL) 1 % rectal ointment 1 Application  1 Application Rectal PRN Abigail Abler, MD        diphenhydrAMINE  (BENADRYL ) capsule 25 mg  25 mg Oral Q6H PRN Abigail Abler, MD       gentamicin (GARAMYCIN) 370 mg in dextrose  5 % 100 mL IVPB  5 mg/kg (Adjusted) Intravenous Q24H Abigail Abler, MD 109.3 mL/hr at 05/27/23 1359 370 mg at 05/27/23 1359   ibuprofen  (ADVIL ) tablet 600 mg  600 mg Oral Q6H Abigail Abler, MD   600 mg at 05/28/23 4132   lactated ringers  infusion   Intravenous Continuous Abigail Abler, MD 125 mL/hr at 05/27/23 1350 New Bag at 05/27/23 1350   lactated ringers  infusion   Intravenous Continuous Abigail Abler, MD 75 mL/hr at 05/27/23 1950 New Bag at 05/27/23 1950   ondansetron  (ZOFRAN ) tablet 4 mg  4 mg Oral Q4H PRN Abigail Abler, MD       Or   ondansetron  (ZOFRAN ) injection 4 mg  4 mg Intravenous Q4H PRN Abigail Abler, MD       oxyCODONE  (Oxy IR/ROXICODONE ) immediate release tablet 10 mg  10 mg Oral Q4H PRN Abigail Abler, MD       oxyCODONE  (Oxy IR/ROXICODONE ) immediate release tablet 5 mg  5 mg Oral Q4H PRN Abigail Abler, MD       prenatal multivitamin tablet 1 tablet  1 tablet Oral Q1200  Abigail Abler, MD       senna-docusate (Senokot-S) tablet 2 tablet  2 tablet Oral Daily Abigail Abler, MD       simethicone  (MYLICON) chewable tablet 80 mg  80 mg Oral PRN Abigail Abler, MD       zolpidem  (AMBIEN ) tablet 5 mg  5 mg Oral QHS PRN Abigail Abler, MD          Labs  Recent Labs  Lab 05/27/23 1303 05/28/23 0428  WBC 14.9* 11.5*  HGB 14.4 15.6*  HCT 43.0 47.5*  PLT 274 245    Recent Labs  Lab 05/27/23 1303  NA 136  K 3.3*  CL 111  CO2 16*  BUN 13  CREATININE 0.79  CALCIUM  8.1*  PROT 7.1  BILITOT 0.8  ALKPHOS 55  ALT 16  AST 15  GLUCOSE 102*      Assessment & Plan:  Postpartum endometritis: improving, last temp was last night -- continue clindamycin/gentamicin until 24-48 hours afebrile -- trend WBC count, repeat in morning -- possible discharge tomorrow    Marci Setter, MD, FACOG Obstetrician &  Gynecologist, [facility], [facility]

## 2023-05-29 ENCOUNTER — Inpatient Hospital Stay (HOSPITAL_COMMUNITY)

## 2023-05-29 DIAGNOSIS — O8612 Endometritis following delivery: Principal | ICD-10-CM

## 2023-05-29 LAB — CBC
HCT: 42.9 % (ref 36.0–46.0)
Hemoglobin: 14.1 g/dL (ref 12.0–15.0)
MCH: 29.6 pg (ref 26.0–34.0)
MCHC: 32.9 g/dL (ref 30.0–36.0)
MCV: 90.1 fL (ref 80.0–100.0)
Platelets: 289 10*3/uL (ref 150–400)
RBC: 4.76 MIL/uL (ref 3.87–5.11)
RDW: 14.2 % (ref 11.5–15.5)
WBC: 8.5 10*3/uL (ref 4.0–10.5)
nRBC: 0 % (ref 0.0–0.2)

## 2023-05-29 MED ORDER — AMOXICILLIN-POT CLAVULANATE 875-125 MG PO TABS: 1.0000 | ORAL_TABLET | Freq: Two times a day (BID) | ORAL | 0 refills | Status: DC

## 2023-05-29 MED ORDER — ONDANSETRON 4 MG PO TBDP: 4.0000 mg | ORAL_TABLET | Freq: Four times a day (QID) | ORAL | 0 refills | Status: DC | PRN

## 2023-05-29 MED ORDER — ACETAMINOPHEN 500 MG PO TABS: 1000.0000 mg | ORAL_TABLET | Freq: Four times a day (QID) | ORAL | 2 refills | Status: DC | PRN

## 2023-05-29 MED ORDER — IBUPROFEN 600 MG PO TABS: 600.0000 mg | ORAL_TABLET | Freq: Four times a day (QID) | ORAL | Status: DC | PRN

## 2023-05-29 MED ORDER — IBUPROFEN 600 MG PO TABS: 600.0000 mg | ORAL_TABLET | Freq: Four times a day (QID) | ORAL | 2 refills | Status: DC | PRN

## 2023-05-29 NOTE — Plan of Care (Signed)
  Problem: Education: Goal: Knowledge of General Education information will improve Description: Including pain rating scale, medication(s)/side effects and non-pharmacologic comfort measures Outcome: Adequate for Discharge   Problem: Health Behavior/Discharge Planning: Goal: Ability to manage health-related needs will improve Outcome: Adequate for Discharge   Problem: Clinical Measurements: Goal: Ability to maintain clinical measurements within normal limits will improve Outcome: Adequate for Discharge Goal: Will remain free from infection Outcome: Adequate for Discharge Goal: Diagnostic test results will improve Outcome: Adequate for Discharge Goal: Respiratory complications will improve Outcome: Adequate for Discharge Goal: Cardiovascular complication will be avoided Outcome: Adequate for Discharge   Problem: Activity: Goal: Risk for activity intolerance will decrease Outcome: Adequate for Discharge   Problem: Nutrition: Goal: Adequate nutrition will be maintained Outcome: Adequate for Discharge   Problem: Coping: Goal: Level of anxiety will decrease Outcome: Adequate for Discharge   Problem: Elimination: Goal: Will not experience complications related to bowel motility Outcome: Adequate for Discharge Goal: Will not experience complications related to urinary retention Outcome: Adequate for Discharge   Problem: Pain Managment: Goal: General experience of comfort will improve and/or be controlled Outcome: Adequate for Discharge   Problem: Safety: Goal: Ability to remain free from injury will improve Outcome: Adequate for Discharge   Problem: Skin Integrity: Goal: Risk for impaired skin integrity will decrease Outcome: Adequate for Discharge   Problem: Education: Goal: Knowledge of condition will improve Outcome: Adequate for Discharge Goal: Individualized Educational Video(s) Outcome: Adequate for Discharge Goal: Individualized Newborn Educational  Video(s) Outcome: Adequate for Discharge   Problem: Activity: Goal: Will verbalize the importance of balancing activity with adequate rest periods Outcome: Adequate for Discharge Goal: Ability to tolerate increased activity will improve Outcome: Adequate for Discharge   Problem: Coping: Goal: Ability to identify and utilize available resources and services will improve Outcome: Adequate for Discharge   Problem: Life Cycle: Goal: Chance of risk for complications during the postpartum period will decrease Outcome: Adequate for Discharge   Problem: Role Relationship: Goal: Ability to demonstrate positive interaction with newborn will improve Outcome: Adequate for Discharge   Problem: Skin Integrity: Goal: Demonstration of wound healing without infection will improve Outcome: Adequate for Discharge

## 2023-05-29 NOTE — Discharge Summary (Signed)
 Obstetric Physician Discharge Summary  Patient ID: Madeline Cooper MRN: [id number] DOB/AGE: February 26, 1990 33 y.o.  Admit date: 05/27/2023 Discharge date: 05/29/2023  Admission Diagnoses: Postpartum endometritis  Discharge Diagnoses: The same  Prenatal Procedures: ultrasound   Hospital Course:  Madeline Cooper is a 33 y.o. G3P3003 admitted for on 05/27/23; PPD#10 s/p uncomplicated SVD, for endometritis.  She was febrile on admission to 100.6, reported a Tmax at home to 102 F.  She was admitted and started on Gentamicin and Clindamycin.  She defervesced, her pain improved and had no other concerning symptoms.  Her WBC went from 14.9 to 8.5.  On 05/29/23, she had an ultrasound that did not show any remarkable findings.   She was deemed stable for discharge to home with outpatient follow up, was discharged with seven day course of Augmentin.  Discharge Exam: Temp:  [97.8 F (36.6 C)-98.7 F (37.1 C)] 98 F (36.7 C) (05/28 0807) Pulse Rate:  [50-69] 50 (05/28 0807) Resp:  [16-17] 16 (05/28 0807) BP: (108-120)/(61-85) 118/80 (05/28 0807) SpO2:  [97 %-99 %] 97 % (05/28 0807) Physical Examination: CONSTITUTIONAL: Well-developed, well-nourished female in no acute distress.  HENT:  Normocephalic, atraumatic, External right and left ear normal.  EYES: Conjunctivae and EOM are normal. Pupils are equal, round, and reactive to light. No scleral icterus.  NECK: Normal range of motion, supple, no masses SKIN: Skin is warm and dry. No rash noted. Not diaphoretic. No erythema. No pallor. NEUROLOGIC: Alert and oriented to person, place, and time. Normal reflexes, muscle tone coordination. No cranial nerve deficit noted. PSYCHIATRIC: Normal mood and affect. Normal behavior. Normal judgment and thought content. CARDIOVASCULAR: Normal heart rate noted, regular rhythm RESPIRATORY: Effort and breath sounds normal, no problems with respiration noted MUSCULOSKELETAL: Normal range of motion. No edema and no  tenderness. 2+ distal pulses. ABDOMEN: Soft, nontender, nondistended CERVIX:  Deferred   Significant Diagnostic Studies:  Results for orders placed or performed during the hospital encounter of 05/27/23 (from the past week)  Culture, blood (Routine X 2) w Reflex to ID Panel   Collection Time: 05/27/23  1:03 PM   Specimen: BLOOD LEFT HAND  Result Value Ref Range   Specimen Description BLOOD LEFT HAND    Special Requests      BOTTLES DRAWN AEROBIC AND ANAEROBIC Blood Culture results may not be optimal due to an inadequate volume of blood received in culture bottles   Culture      NO GROWTH 2 DAYS Performed at [facility], [address], [place], [place] [address]    Report Status PENDING   CBC with Differential/Platelet   Collection Time: 05/27/23  1:03 PM  Result Value Ref Range   WBC 14.9 (H) 4.0 - 10.5 K/uL   RBC 4.80 3.87 - 5.11 MIL/uL   Hemoglobin 14.4 12.0 - 15.0 g/dL   HCT 41.3 24.4 - 01.0 %   MCV 89.6 80.0 - 100.0 fL   MCH 30.0 26.0 - 34.0 pg   MCHC 33.5 30.0 - 36.0 g/dL   RDW 27.2 53.6 - 64.4 %   Platelets 274 150 - 400 K/uL   nRBC 0.0 0.0 - 0.2 %   Neutrophils Relative % 80 %   Neutro Abs 11.8 (H) 1.7 - 7.7 K/uL   Lymphocytes Relative 13 %   Lymphs Abs 1.9 0.7 - 4.0 K/uL   Monocytes Relative 7 %   Monocytes Absolute 1.1 (H) 0.1 - 1.0 K/uL   Eosinophils Relative 0 %   Eosinophils Absolute  0.0 0.0 - 0.5 K/uL   Basophils Relative 0 %   Basophils Absolute 0.1 0.0 - 0.1 K/uL   Immature Granulocytes 0 %   Abs Immature Granulocytes 0.04 0.00 - 0.07 K/uL  Comprehensive metabolic panel   Collection Time: 05/27/23  1:03 PM  Result Value Ref Range   Sodium 136 135 - 145 mmol/L   Potassium 3.3 (L) 3.5 - 5.1 mmol/L   Chloride 111 98 - 111 mmol/L   CO2 16 (L) 22 - 32 mmol/L   Glucose, Bld 102 (H) 70 - 99 mg/dL   BUN 13 6 - 20 mg/dL   Creatinine, Ser 2.95 0.44 - 1.00 mg/dL   Calcium  8.1 (L) 8.9 - 10.3 mg/dL   Total Protein 7.1 6.5 - 8.1 g/dL   Albumin 3.3 (L)  3.5 - 5.0 g/dL   AST 15 15 - 41 U/L   ALT 16 0 - 44 U/L   Alkaline Phosphatase 55 38 - 126 U/L   Total Bilirubin 0.8 0.0 - 1.2 mg/dL   GFR, Estimated >62 >13 mL/min   Anion gap 9 5 - 15  Lactic acid, plasma   Collection Time: 05/27/23  1:03 PM  Result Value Ref Range   Lactic Acid, Venous 0.9 0.5 - 1.9 mmol/L  Culture, blood (Routine X 2) w Reflex to ID Panel   Collection Time: 05/27/23  1:08 PM   Specimen: BLOOD RIGHT FOREARM  Result Value Ref Range   Specimen Description BLOOD RIGHT FOREARM    Special Requests      BOTTLES DRAWN AEROBIC AND ANAEROBIC Blood Culture results may not be optimal due to an inadequate volume of blood received in culture bottles   Culture      NO GROWTH 2 DAYS Performed at [facility], [address], [place], [place] [address]    Report Status PENDING   Culture, OB Urine   Collection Time: 05/27/23  1:09 PM   Specimen: Urine, Random  Result Value Ref Range   Specimen Description URINE, RANDOM    Special Requests NONE    Culture (A)     MULTIPLE SPECIES PRESENT, SUGGEST RECOLLECTION NO GROUP B STREP (S.AGALACTIAE) ISOLATED Performed at [facility], [address], [place], [place] [address]    Report Status 05/28/2023 FINAL   Urinalysis, Routine w reflex microscopic -Urine, Clean Catch   Collection Time: 05/27/23  1:18 PM  Result Value Ref Range   Color, Urine YELLOW YELLOW   APPearance CLEAR CLEAR   Specific Gravity, Urine 1.023 1.005 - 1.030   pH 6.0 5.0 - 8.0   Glucose, UA NEGATIVE NEGATIVE mg/dL   Hgb urine dipstick MODERATE (A) NEGATIVE   Bilirubin Urine NEGATIVE NEGATIVE   Ketones, ur NEGATIVE NEGATIVE mg/dL   Protein, ur 295 (A) NEGATIVE mg/dL   Nitrite NEGATIVE NEGATIVE   Leukocytes,Ua SMALL (A) NEGATIVE   RBC / HPF 0-5 0 - 5 RBC/hpf   WBC, UA 0-5 0 - 5 WBC/hpf   Bacteria, UA NONE SEEN NONE SEEN   Squamous Epithelial / HPF 0-5 0 - 5 /HPF   Mucus PRESENT   CBC   Collection Time: 05/28/23  4:28 AM  Result Value  Ref Range   WBC 11.5 (H) 4.0 - 10.5 K/uL   RBC 5.14 (H) 3.87 - 5.11 MIL/uL   Hemoglobin 15.6 (H) 12.0 - 15.0 g/dL   HCT 28.4 (H) 13.2 - 44.0 %   MCV 92.4 80.0 - 100.0 fL   MCH 30.4 26.0 -  34.0 pg   MCHC 32.8 30.0 - 36.0 g/dL   RDW 53.6 64.4 - 03.4 %   Platelets 245 150 - 400 K/uL   nRBC 0.0 0.0 - 0.2 %  CBC   Collection Time: 05/29/23  6:05 AM  Result Value Ref Range   WBC 8.5 4.0 - 10.5 K/uL   RBC 4.76 3.87 - 5.11 MIL/uL   Hemoglobin 14.1 12.0 - 15.0 g/dL   HCT 74.2 59.5 - 63.8 %   MCV 90.1 80.0 - 100.0 fL   MCH 29.6 26.0 - 34.0 pg   MCHC 32.9 30.0 - 36.0 g/dL   RDW 75.6 43.3 - 29.5 %   Platelets 289 150 - 400 K/uL   nRBC 0.0 0.0 - 0.2 %    US  PELVIS (TRANSABDOMINAL ONLY) Result Date: 05/29/2023 CLINICAL DATA:  Postpartum endometritis, spontaneous vaginal delivery 10 days ago EXAM: TRANSABDOMINAL ULTRASOUND OF PELVIS TECHNIQUE: Transabdominal ultrasound examination of the pelvis was performed including evaluation of the uterus, ovaries, adnexal regions, and pelvic cul-de-sac. COMPARISON:  05/03/2023 FINDINGS: Uterus Measurements: 14.1 x 9.8 x 8.4 cm = volume: 613.8 mL. Enlarged uterus consistent with postpartum state. No focal masses. Endometrium Thickness: 39 mm. The endometrium is thickened, with fluid in the endometrial cavity. No significant increased vascularity identified on color Doppler imaging. Right ovary Not visualized. Left ovary Measurements: 4.2 x 4.5 x 7.0 cm = volume: 60.3 mL. 4.2 x 6.2 x 3.8 cm simple appearing cyst identified. No other adnexal masses. Other findings:  No abnormal free fluid. IMPRESSION: 1. Thickened endometrium with simple appearing fluid in the endometrial cavity, nonspecific given recent postpartum state. No echogenic material is identified to suggest retained products of conception, and there is no increased vascularity on color Doppler imaging as would be expected with endometritis. Continued clinical follow-up recommended. 2. Nonvisualization of the  right ovary. 3. Left ovarian probable benign cyst measuring 6.2 cm. Recommend follow-up pelvic ultrasound in 3-6 months. Reference: Radiology 2019 Nov;293(2):359-371 Electronically Signed   By: Bobbye Burrow M.D.   On: 05/29/2023 09:02     Future Appointments  Date Time Provider Department Center  07/01/2023  8:30 AM [facility] None  07/01/2023  9:15 AM Izell Marsh, MD [facility] None    Discharge Condition: Stable  Discharge disposition: 01-Home or Self Care        Allergies as of 05/29/2023   No Known Allergies      Medication List     TAKE these medications    acetaminophen  500 MG tablet Commonly known as: TYLENOL  Take 2 tablets (1,000 mg total) by mouth every 6 (six) hours as needed for mild pain (pain score 1-3) or fever. What changed:  how much to take reasons to take this   amoxicillin-clavulanate 875-125 MG tablet Commonly known as: AUGMENTIN Take 1 tablet by mouth 2 (two) times daily.   ibuprofen  600 MG tablet Commonly known as: ADVIL  Take 1 tablet (600 mg total) by mouth every 6 (six) hours as needed for moderate pain (pain score 4-6) or cramping.   ondansetron  4 MG disintegrating tablet Commonly known as: ZOFRAN -ODT Take 1 tablet (4 mg total) by mouth every 6 (six) hours as needed for nausea.   Vitafol  Ultra 29-0.6-0.4-200 MG Caps Take 1 capsule by mouth daily.         Total discharge time: 20 minutes   Signed: Lenoard Rad M.D. 05/29/2023, 10:41 AM

## 2023-05-29 NOTE — Progress Notes (Signed)
 Gynecology Progress Note  Admission Date: 05/27/2023 Current Date: 05/29/2023 7:52 AM  Madeline Cooper is a 33 y.o. [id number] HD#3 admitted for postpartum endometritis s/p 5/16 SVD/intact perineum  Subjective:  Continues to feel better. Denies any belly/pelvic pain, AUB, cardiac s/s.   Objective:   Patient Vitals for the past 24 hrs:  BP Temp Temp src Pulse Resp SpO2  05/29/23 0621 118/68 97.8 F (36.6 C) Oral (!) 51 17 98 %  05/29/23 0035 120/61 98 F (36.7 C) Oral (!) 50 17 --  05/28/23 1950 108/63 98.6 F (37 C) Oral 69 16 97 %  05/28/23 1605 115/85 98.7 F (37.1 C) Oral (!) 56 16 99 %  05/28/23 1220 115/72 97.8 F (36.6 C) Oral 67 17 98 %  05/28/23 0808 (!) 98/59 98.1 F (36.7 C) Oral (!) 59 16 97 %   Last temp: 5/26 @ 2045 38.0  Physical exam: General appearance: alert, cooperative, and appears stated age Abdomen: soft, nontender, nondistended, fundus firm and nontender below umbilicus Pulm: no respiratory distress Extremities:no edema or erythema in bilateral lower extremities  Medications Current Facility-Administered Medications  Medication Dose Route Frequency Provider Last Rate Last Admin   acetaminophen  (TYLENOL ) tablet 650 mg  650 mg Oral Q4H PRN Abigail Abler, MD   650 mg at 05/27/23 2052   benzocaine -Menthol  (DERMOPLAST) 20-0.5 % topical spray 1 Application  1 Application Topical PRN Abigail Abler, MD       clindamycin (CLEOCIN) IVPB 900 mg  900 mg Intravenous Q8H Abigail Abler, MD 100 mL/hr at 05/29/23 0627 900 mg at 05/29/23 4540   coconut oil  1 Application Topical PRN Abigail Abler, MD       witch hazel-glycerin  (TUCKS) pad 1 Application  1 Application Topical PRN Abigail Abler, MD       And   dibucaine (NUPERCAINAL) 1 % rectal ointment 1 Application  1 Application Rectal PRN Abigail Abler, MD       diphenhydrAMINE  (BENADRYL ) capsule 25 mg  25 mg Oral Q6H PRN Abigail Abler, MD       gentamicin (GARAMYCIN) 370 mg in dextrose  5 % 100 mL  IVPB  5 mg/kg (Adjusted) Intravenous Q24H Abigail Abler, MD 109.3 mL/hr at 05/28/23 1309 370 mg at 05/28/23 1309   ibuprofen  (ADVIL ) tablet 600 mg  600 mg Oral Q6H PRN Raynell Caller, MD       ondansetron  (ZOFRAN ) tablet 4 mg  4 mg Oral Q4H PRN Abigail Abler, MD       Or   ondansetron  (ZOFRAN ) injection 4 mg  4 mg Intravenous Q4H PRN Abigail Abler, MD       oxyCODONE  (Oxy IR/ROXICODONE ) immediate release tablet 10 mg  10 mg Oral Q4H PRN Abigail Abler, MD       oxyCODONE  (Oxy IR/ROXICODONE ) immediate release tablet 5 mg  5 mg Oral Q4H PRN Abigail Abler, MD       prenatal multivitamin tablet 1 tablet  1 tablet Oral Q1200 Abigail Abler, MD   1 tablet at 05/28/23 1306   senna-docusate (Senokot-S) tablet 2 tablet  2 tablet Oral Daily Abigail Abler, MD   2 tablet at 05/28/23 1307   simethicone  (MYLICON) chewable tablet 80 mg  80 mg Oral PRN Abigail Abler, MD       zolpidem  (AMBIEN ) tablet 5 mg  5 mg Oral QHS PRN Abigail Abler, MD       Labs  5/26  UCx negative 5/26 BCx NGTD  5/9: GBS neg  Recent Labs  Lab 05/27/23 1303 05/28/23 0428 05/29/23 0605  WBC 14.9* 11.5* 8.5  HGB 14.4 15.6* 14.1  HCT 43.0 47.5* 42.9  PLT 274 245 289    Radiology: No new imaging  Assessment & Plan:  Patient doing well D/w her recommend u/s to make sure no retained POCs; delivery note review and no concern for retained POCs and did LUS sweep to help with bleeding. If u/s negative d/w her okay to go home today likely without abx  Tyler Gallant MD Attending [facility]) GYN Consult Phone: [phone] (M-F, 0800-1700) & [phone] (Off hours, weekends, holidays)

## 2023-05-30 ENCOUNTER — Ambulatory Visit: Payer: Self-pay | Admitting: Obstetrics and Gynecology

## 2023-05-30 DIAGNOSIS — R001 Bradycardia, unspecified: Secondary | ICD-10-CM | POA: Insufficient documentation

## 2023-05-31 ENCOUNTER — Telehealth (HOSPITAL_COMMUNITY): Payer: Self-pay | Admitting: *Deleted

## 2023-05-31 NOTE — Telephone Encounter (Signed)
 05/31/2023  Name: Madeline Cooper MRN: [id number] DOB: [birth year]  Reason for Call:  Transition of Care Hospital Discharge Call  Contact Status: Patient Contact Status: Unable to contact ("voice mail box full", unable to leave a message)  Language assistant needed:          Follow-Up Questions:    Dimple Francis Postnatal Depression Scale:  In the Past 7 Days:    PHQ2-9 Depression Scale:     Discharge Follow-up:    Post-discharge interventions: NA  Pearlie Bougie, RN 05/31/2023 10:25

## 2023-05-31 NOTE — Telephone Encounter (Signed)
 05/31/2023  Name: Madeline Cooper MRN: [id number] DOB: 08-Apr-1990  Reason for Call:  Transition of Care Hospital Discharge Call  Contact Status: Patient Contact Status: Complete Patient called back. Language assistant needed: Interpreter Mode: Interpreter Not Needed        Follow-Up Questions: Do You Have Any Concerns About Your Health As You Heal From Delivery?: No Do You Have Any Concerns About Your Infants Health?: No  Edinburgh Postnatal Depression Scale:  In the Past 7 Days:    PHQ2-9 Depression Scale:     Discharge Follow-up: Edinburgh score requires follow up?:  (declines screening today, says answers are the same as in the hospital when her score was "0", endorses she is doing well emotionally) Patient was advised of the following resources:: Support Group, Breastfeeding Support Group (declines postpartum group information via email)  Post-discharge interventions: Reviewed Newborn Safe Sleep Practices  Pearlie Bougie, RN 05/21/2023 10:32

## 2023-06-01 LAB — CULTURE, BLOOD (ROUTINE X 2)
Culture: NO GROWTH
Culture: NO GROWTH

## 2023-07-01 ENCOUNTER — Other Ambulatory Visit

## 2023-07-01 ENCOUNTER — Ambulatory Visit: Admitting: Obstetrics and Gynecology

## 2023-07-12 ENCOUNTER — Ambulatory Visit (INDEPENDENT_AMBULATORY_CARE_PROVIDER_SITE_OTHER): Admitting: Physician Assistant

## 2023-07-12 ENCOUNTER — Encounter: Payer: Self-pay | Admitting: Physician Assistant

## 2023-07-12 ENCOUNTER — Other Ambulatory Visit

## 2023-07-12 DIAGNOSIS — Z8632 Personal history of gestational diabetes: Secondary | ICD-10-CM

## 2023-07-12 DIAGNOSIS — N719 Inflammatory disease of uterus, unspecified: Secondary | ICD-10-CM

## 2023-07-12 NOTE — Progress Notes (Unsigned)
    Post Partum Visit Note  Madeline Cooper is a 33 y.o. [id number] female who presents for a postpartum visit. She is 8 weeks postpartum following a normal spontaneous vaginal delivery.  I have fully reviewed the prenatal and intrapartum course. The delivery was at 37 gestational weeks.  Anesthesia: epidural. Postpartum course has been okay. Baby is doing well. Baby is feeding by both breast and bottle - Similac Advance. Bleeding--no bleeding . Bowel function is normal. Bladder function is normal. Patient is sexually active. Contraception method is none. Postpartum depression screening: negative.   The pregnancy intention screening data noted above was reviewed. Potential methods of contraception were discussed. The patient elected to proceed with No data recorded.    Health Maintenance Due  Topic Date Due   Hepatitis B Vaccines (1 of 3 - 19+ 3-dose series) Never done   HPV VACCINES (1 - 3-dose SCDM series) Never done   COVID-19 Vaccine (3 - 2024-25 season) 09/02/2022   Cervical Cancer Screening (HPV/Pap Cotest)  03/03/2023    {Common ambulatory SmartLinks:19316}  Review of Systems {ros; complete:30496}  Objective:  Ht 5' 4 (1.626 m)   Wt 204 lb (92.5 kg)   BMI 35.02 kg/m    General:  {gen appearance:16600}   Breasts:  {desc; normal/abnormal/not indicated:14647}  Lungs: {lung exam:16931}  Heart:  {heart exam:5510}  Abdomen: {abdomen exam:16834}   Wound {Wound assessment:11097}  GU exam:  {desc; normal/abnormal/not indicated:14647}       Assessment:    1. History of gestational diabetes ***   *** postpartum exam.   Plan:   Essential components of care per ACOG recommendations:  1.  Mood and well being: Patient with {gen negative/positive:315881} depression screening today. Reviewed local resources for support.  - Patient tobacco use? {tobacco use:25506}  - hx of drug use? {yes/no:25505}    2. Infant care and feeding:  -Patient currently breastmilk feeding?  {yes/no:25502}  -Social determinants of health (SDOH) reviewed in EPIC. No concerns***The following needs were identified***  3. Sexuality, contraception and birth spacing - Patient {DOES_DOES WNU:81435} want a pregnancy in the next year.  Desired family size is {NUMBER 1-10:22536} children.  - Reviewed reproductive life planning. Reviewed contraceptive methods based on pt preferences and effectiveness.  Patient desired {Upstream End Methods:24109} today.   - Discussed birth spacing of 18 months  4. Sleep and fatigue -Encouraged family/partner/community support of 4 hrs of uninterrupted sleep to help with mood and fatigue  5. Physical Recovery  - Discussed patients delivery and complications. She describes her labor as {description:25511} - Patient had a {CHL AMB DELIVERY:[phone]}. Patient had a {laceration:25518} laceration. Perineal healing reviewed. Patient expressed understanding - Patient has urinary incontinence? {yes/no:25515} - Patient {ACTION; IS/IS WNU:78978602} safe to resume physical and sexual activity  6.  Health Maintenance - HM due items addressed {Yes or If no, why not?:20788} - Last pap smear  Diagnosis  Date Value Ref Range Status  03/02/2020   Final   - Negative for intraepithelial lesion or malignancy (NILM)   Pap smear {done:10129} at today's visit.   -Breast Cancer screening indicated? {indicated:25516}  7. Chronic Disease/Pregnancy Condition follow up: {Follow up:25499} Patient declines app smear  - PCP follow up  Alvia Rosina GAILS, CMA [facility], [facility]

## 2023-07-13 LAB — GLUCOSE TOLERANCE, 2 HOURS
Glucose, 2 hour: 67 mg/dL — ABNORMAL LOW (ref 70–139)
Glucose, GTT - Fasting: 91 mg/dL (ref 70–99)

## 2023-09-18 NOTE — Progress Notes (Signed)
  Cardiology Office Note:  .   Date:  09/18/2023  ID:  Madeline Cooper, DOB [birth year], MRN [id number] PCP: Patient, No Pcp Per  [facility] Providers Cardiologist:  Newman Lawrence, MD PCP: Patient, No Pcp Per  No chief complaint on file.    Madeline Cooper is a 33 y.o. female with *** Discussed the use of AI scribe software for clinical note transcription with the patient, who gave verbal consent to proceed.  History of Present Illness       There were no vitals filed for this visit.    ROS      Studies Reviewed: .        *** Labs ***/202***: Chol ***, TG ***, HDL ***, LDL *** HbA1C ***% Hb *** Cr *** ***  Risk Assessment/Calculations:   {Does this patient have ATRIAL FIBRILLATION?:[phone]}    Physical Exam   VISIT DIAGNOSES: No diagnosis found.   Madeline Cooper is a 33 y.o. female with *** Assessment and Plan Assessment & Plan       {Are you ordering a CV Procedure (e.g. stress test, cath, DCCV, TEE, etc)?   Press F2        :[id number]}    No orders of the defined types were placed in this encounter.    F/u in ***  Signed, Newman JINNY Lawrence, MD

## 2023-09-20 ENCOUNTER — Ambulatory Visit: Attending: Cardiology | Admitting: Cardiology

## 2023-09-20 ENCOUNTER — Encounter: Payer: Self-pay | Admitting: Cardiology

## 2023-09-20 VITALS — BP 110/80 | HR 83 | Ht 64.0 in | Wt 213.6 lb

## 2023-09-20 DIAGNOSIS — R001 Bradycardia, unspecified: Secondary | ICD-10-CM | POA: Insufficient documentation

## 2023-09-25 ENCOUNTER — Other Ambulatory Visit: Payer: Self-pay | Admitting: Medical Genetics

## 2023-11-21 ENCOUNTER — Other Ambulatory Visit

## 2023-11-21 DIAGNOSIS — Z006 Encounter for examination for normal comparison and control in clinical research program: Secondary | ICD-10-CM

## 2023-12-11 LAB — GENECONNECT MOLECULAR SCREEN: Genetic Analysis Overall Interpretation: NEGATIVE
